# Patient Record
Sex: Male | Born: 1980 | Race: Black or African American | Hispanic: No | Marital: Married | Smoking: Never smoker
Health system: Southern US, Community
[De-identification: ages and names within clinical notes are randomized; demographics above are authoritative.]

## PROBLEM LIST (undated history)

## (undated) DIAGNOSIS — E291 Testicular hypofunction: Secondary | ICD-10-CM

## (undated) HISTORY — DX: Testicular hypofunction: E29.1

---

## 2015-01-21 ENCOUNTER — Ambulatory Visit (INDEPENDENT_AMBULATORY_CARE_PROVIDER_SITE_OTHER): Payer: PRIVATE HEALTH INSURANCE | Admitting: Family Medicine

## 2015-01-21 ENCOUNTER — Encounter: Payer: Self-pay | Admitting: Family Medicine

## 2015-01-21 VITALS — BP 117/74 | HR 61 | Temp 98.1°F | Resp 16 | Ht 67.5 in | Wt 169.0 lb

## 2015-01-21 DIAGNOSIS — T148 Other injury of unspecified body region: Secondary | ICD-10-CM

## 2015-01-21 DIAGNOSIS — L729 Follicular cyst of the skin and subcutaneous tissue, unspecified: Secondary | ICD-10-CM | POA: Diagnosis not present

## 2015-01-21 DIAGNOSIS — W57XXXA Bitten or stung by nonvenomous insect and other nonvenomous arthropods, initial encounter: Secondary | ICD-10-CM | POA: Diagnosis not present

## 2015-01-21 DIAGNOSIS — L639 Alopecia areata, unspecified: Secondary | ICD-10-CM

## 2015-01-21 MED ORDER — FLUTICASONE PROPIONATE 0.05 % EX CREA: TOPICAL_CREAM | CUTANEOUS | Status: DC

## 2015-01-21 NOTE — Progress Notes (Signed)
Office Note 01/21/2015  CC:  Chief Complaint  Patient presents with  . Establish Care  . Tick Removal    a few months, still lump  . Mass    under arm on abd.  . Cyst    on the back of his head  . Urinary Incontinence   HPI:  Paul Mosley is a 34 y.o. Black male who is here to establish care. Patient's most recent primary MD: none Old records were not reviewed prior to or during today's visit.  Has felt a swelling on the scalp for years, not signif enlarging per pt.  No itching or pain.  It started when he was living in [place].  It has never opened up and drained.    Also has tick bite on right upper part of leg, present at least 2 mo and still itches.  It has looked the same the entire time, has not put anything on it.  Wife thought she felt a mass/nodule in right axillary area recently, but now she and pt say they can't find it.  History reviewed. No pertinent past medical history.  History reviewed. No pertinent past surgical history.  Family History  Problem Relation Age of Onset  . Transient ischemic attack Brother 45    History   Social History  . Marital Status: Married    Spouse Name: N/A  . Number of Children: N/A  . Years of Education: N/A   Occupational History  . Not on file.   Social History Main Topics  . Smoking status: Never Smoker   . Smokeless tobacco: Never Used  . Alcohol Use: Yes     Comment: occassionally  . Drug Use: No  . Sexual Activity: Not on file   Other Topics Concern  . Not on file   Social History Narrative   Married, has 2 biologic children, 4 stepchildren.   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   From [place] orig, lived in [place] x 5 yrs prior to coming to [place].   No tob, occ alc, no drugs.    Outpatient Encounter Prescriptions as of 01/21/2015  Medication Sig  . Multiple Vitamin (MULTIVITAMIN) tablet Take 1 tablet by mouth daily.  . fluticasone (CUTIVATE) 0.05 % cream Apply to affected area  bid prn   No facility-administered encounter medications on file as of 01/21/2015.    No Known Allergies  ROS Review of Systems  Constitutional: Negative for fever and fatigue.  HENT: Negative for congestion and sore throat.   Eyes: Negative for visual disturbance.  Respiratory: Negative for cough.   Cardiovascular: Negative for chest pain.  Gastrointestinal: Negative for nausea and abdominal pain.  Genitourinary: Negative for dysuria.       +ED and ? Urinary hesitancy?--not fully discussed today  Musculoskeletal: Negative for back pain and joint swelling.  Skin: Negative for rash.  Neurological: Negative for weakness and headaches.  Hematological: Negative for adenopathy.    PE; Blood pressure 117/74, pulse 61, temperature 98.1 F (36.7 C), temperature source Temporal, resp. rate 16, height 5' 7.5" (1.715 m), weight 169 lb (76.658 kg), SpO2 99 %. Gen: Alert, well appearing.  Patient is oriented to person, place, time, and situation. Head: slight male pattern hair loss, with a bit of patchy alopecia in frontal scalp region as well as right parietal scalp region.  At approximately the vertex of scalp there is a 1-2 cm soft, fluctuant subcutaneous nodule c/w a cyst.  No overlying scalp/skin/hair changes.  No flaking of scalp or rash/erythema of scalp. ENT: Ears: EACs clear, normal epithelium.  TMs with good light reflex and landmarks bilaterally.  Eyes: no injection, icteris, swelling, or exudate.  EOMI, PERRLA. Nose: no drainage or turbinate edema/swelling.  No injection or focal lesion.  Mouth: lips without lesion/swelling.  Oral mucosa pink and moist.  Dentition intact and without obvious caries or gingival swelling.  Oropharynx without erythema, exudate, or swelling.  Neck - No masses or thyromegaly or limitation in range of motion CV: RRR, no m/r/g.  Chest wall, back, and axilla palpated in it's entirety and no nodule/mass is palpable. LUNGS: CTA bilat, nonlabored resps, good  aeration in all lung fields. SKIN: right posterolateral thigh with 2 mm pink papule with the top excoriated.  No erythema or tenderness.  Pertinent labs:  none  ASSESSMENT AND PLAN:   New pt; he'll bring his vaccine records when he comes for next visit.  1) Scalp cyst; reassured, recommended watchful waiting approach.  2) Tick bite, mild inflammitory papule still remaining.  Cutivate 0.05% cream rx'd to apply bid prn.  3) Alopecia areata, scalp.  Reassured.  Pt usually shaves his head, so this is not a cosmetic issue for him.  An After Visit Summary was printed and given to the patient.  Return for return at your convenience to discuss urinary complaint (30 min).

## 2015-01-21 NOTE — Progress Notes (Signed)
Pre visit review using our clinic review tool, if applicable. No additional management support is needed unless otherwise documented below in the visit note. 

## 2015-02-03 ENCOUNTER — Ambulatory Visit (INDEPENDENT_AMBULATORY_CARE_PROVIDER_SITE_OTHER): Payer: PRIVATE HEALTH INSURANCE | Admitting: Family Medicine

## 2015-02-03 ENCOUNTER — Encounter: Payer: Self-pay | Admitting: Family Medicine

## 2015-02-03 VITALS — BP 111/73 | HR 67 | Temp 98.3°F | Resp 16 | Ht 67.5 in | Wt 167.0 lb

## 2015-02-03 DIAGNOSIS — R3916 Straining to void: Secondary | ICD-10-CM

## 2015-02-03 DIAGNOSIS — N411 Chronic prostatitis: Secondary | ICD-10-CM | POA: Diagnosis not present

## 2015-02-03 DIAGNOSIS — R3911 Hesitancy of micturition: Secondary | ICD-10-CM

## 2015-02-03 LAB — POCT URINALYSIS DIPSTICK
Bilirubin, UA: NEGATIVE
Blood, UA: NEGATIVE
Glucose, UA: NEGATIVE
Ketones, UA: NEGATIVE
Leukocytes, UA: NEGATIVE
Nitrite, UA: NEGATIVE
PROTEIN UA: NEGATIVE
Spec Grav, UA: 1.025
Urobilinogen, UA: 0.2
pH, UA: 5.5

## 2015-02-03 MED ORDER — LEVOFLOXACIN 500 MG PO TABS: 500.0000 mg | ORAL_TABLET | Freq: Every day | ORAL | Status: DC

## 2015-02-03 NOTE — Progress Notes (Signed)
OFFICE VISIT  02/03/2015   CC:  Chief Complaint  Patient presents with  . Urinary Retention    x a few years, has to strain   HPI:    Patient is a 34 y.o. African male who presents for at least a few year history of urinary hesitancy and straining to urinate.  Stream is weak and "split in two".  Denies urinary urgency or nocturia.  No dysuria or hematuria.  Denies hx of UTI or STD.  He is married.  He reports no problems with erections or intercourse but does complain of premature ejaculation.  PMH: allergic rhinitis  No past surgical history on file.  Outpatient Prescriptions Prior to Visit  Medication Sig Dispense Refill  . fluticasone (CUTIVATE) 0.05 % cream Apply to affected area bid prn 30 g 0  . Multiple Vitamin (MULTIVITAMIN) tablet Take 1 tablet by mouth daily.     No facility-administered medications prior to visit.    No Known Allergies  ROS As per HPI  PE: Blood pressure 111/73, pulse 67, temperature 98.3 F (36.8 C), temperature source Oral, resp. rate 16, height 5' 7.5" (1.715 m), weight 167 lb (75.751 kg), SpO2 97 %. Gen: Alert, well appearing.  Patient is oriented to person, place, time, and situation. Genitals normal; both testes normal without tenderness, masses, hydroceles, varicoceles, erythema or swelling. Shaft normal, circumcised, meatus normal without discharge. No inguinal hernia noted. No inguinal lymphadenopathy. Rectal exam: negative without mass, lesions or tenderness, PROSTATE EXAM: normal in size, symmetrical, diffuse tenderness noted + need to urinate felt by pt.  No induration or nodularity.  LABS:  CC UA today: normal  IMPRESSION AND PLAN:  1) Lower urinary tract obstructive sx's that suggest urethral stricture. However, with tender prostate today I'll treat empirically for chronic prostatitis with levaquin 500 mg qd x 14d. If improving then will give longer course of abx. If not improved any then will ask urology to see him to assess for  where his level of obstruction may be.  2) Premature ejaculation: discussed some techniques today to help with this.  Spent 30 min with pt today, with >50% of this time spent in counseling and care coordination regarding the above problems.  An After Visit Summary was printed and given to the patient.  FOLLOW UP:  He is to call in 2 wks with report of how he has responded to the antibiotic

## 2015-02-03 NOTE — Progress Notes (Signed)
Pre visit review using our clinic review tool, if applicable. No additional management support is needed unless otherwise documented below in the visit note. 

## 2015-11-08 DIAGNOSIS — E291 Testicular hypofunction: Secondary | ICD-10-CM

## 2015-11-08 HISTORY — DX: Testicular hypofunction: E29.1

## 2015-11-29 ENCOUNTER — Ambulatory Visit (INDEPENDENT_AMBULATORY_CARE_PROVIDER_SITE_OTHER): Payer: BLUE CROSS/BLUE SHIELD | Admitting: Family Medicine

## 2015-11-29 ENCOUNTER — Encounter: Payer: Self-pay | Admitting: Family Medicine

## 2015-11-29 VITALS — BP 135/92 | HR 64 | Temp 98.3°F | Resp 16 | Ht 67.5 in | Wt 166.8 lb

## 2015-11-29 DIAGNOSIS — R6882 Decreased libido: Secondary | ICD-10-CM | POA: Diagnosis not present

## 2015-11-29 DIAGNOSIS — R5383 Other fatigue: Secondary | ICD-10-CM | POA: Diagnosis not present

## 2015-11-29 NOTE — Progress Notes (Signed)
OFFICE VISIT  11/29/2015   CC:  Chief Complaint  Patient presents with  . Fatigue  . Insomnia   HPI:    Patient is a 35 y.o.  male who presents for fatigue. States he feels very tired upon awakening and can't get himself to feel awake for a while during the day most days. Onset about a month ago, admits some days are worse than others.  No excessive daytime sleepiness.  Gets about 7-8 hours of sleep per night. Says his body feels tired.  His work is a lot of physical labor.  Some occasional exercise but nothing regular.  Some issues with lack of sexual drive the last 1 mo, occ also erectile dysfunction.  Has had snoring but no witnessed apneic events. +allergic rhinitis sx's more lately.  Sounds like he has a stressful home life: has current wife here, has former wife with his kids in [place].  They all fight a lot some days.    No past medical history on file. No signif past medical problems.  No past surgical history on file. No past surgeries.  Social History   Social History Narrative   Married, has 2 biologic children, 4 stepchildren.   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   From [place], lived in [place] x 5 yrs [place] to coming to [place].   No tob, occ alc, no drugs.    MEDS: MVI qd, otc eye drop and oral allergy med on/off lately  No Known Allergies  ROS Review of Systems  Constitutional: Negative for fever, chills, appetite change and fatigue.  HENT: Positive for congestion, postnasal drip, rhinorrhea and sneezing. Negative for dental problem, ear pain and sore throat.   Eyes: Negative for discharge, redness and visual disturbance.  Respiratory: Negative for cough, chest tightness, shortness of breath and wheezing.   Cardiovascular: Negative for chest pain, palpitations and leg swelling.  Gastrointestinal: Negative for nausea, vomiting, abdominal pain, diarrhea and blood in stool.  Genitourinary: Negative for dysuria, urgency,  frequency, hematuria, flank pain and difficulty urinating.  Musculoskeletal: Negative for myalgias, back pain, joint swelling, arthralgias and neck stiffness.  Skin: Negative for pallor and rash.  Neurological: Negative for dizziness, speech difficulty, weakness and headaches.  Hematological: Negative for adenopathy. Does not bruise/bleed easily.  Psychiatric/Behavioral: Negative for confusion and sleep disturbance. The patient is not nervous/anxious.    PE: Blood pressure 135/92, pulse 64, temperature 98.3 F (36.8 C), temperature source Oral, resp. rate 16, height 5' 7.5" (1.715 m), weight 166 lb 12 oz (75.637 kg), SpO2 98 %.  Gen: Alert, well appearing.  Patient is oriented to person, place, time, and situation. HQI:ONGEENT:Eyes: no injection, icteris, swelling, or exudate.  EOMI, PERRLA. Mouth: lips without lesion/swelling.  Oral mucosa pink and moist. Oropharynx without erythema, exudate, or swelling.  Neck - No masses or thyromegaly or limitation in range of motion CV: RRR, no m/r/g.   LUNGS: CTA bilat, nonlabored resps, good aeration in all lung fields. ABD: soft, NT, ND, BS normal.  No hepatospenomegaly or mass.  No bruits. EXT: no clubbing, cyanosis, or edema.   LABS:  None today Quality of sleep questionairre score= 5 (low risk of OSA)  IMPRESSION AND PLAN:  Fatigue x 1 mo or so. His stressful home life probably is playing a role (poor sleep, lots of arguments). His job is pretty physical as well. His VS and exam are reassuring here today. Will check CBC, CMET, TSH, and testosterone labs. If all  normal then will reassure pt and take watchful waiting approach.  An After Visit Summary was printed and given to the patient.  FOLLOW UP: Return if symptoms worsen or fail to improve.  Signed:  Santiago Bumpers, MD           11/29/2015

## 2015-11-29 NOTE — Progress Notes (Signed)
Pre visit review using our clinic review tool, if applicable. No additional management support is needed unless otherwise documented below in the visit note. 

## 2015-11-30 LAB — CBC WITH DIFFERENTIAL/PLATELET
Basophils Absolute: 0 10*3/uL (ref 0.0–0.1)
Basophils Relative: 0.3 % (ref 0.0–3.0)
Eosinophils Absolute: 0.1 10*3/uL (ref 0.0–0.7)
Eosinophils Relative: 2.8 % (ref 0.0–5.0)
HEMATOCRIT: 39.6 % (ref 39.0–52.0)
Hemoglobin: 13.4 g/dL (ref 13.0–17.0)
Lymphocytes Relative: 39.4 % (ref 12.0–46.0)
Lymphs Abs: 1.8 10*3/uL (ref 0.7–4.0)
MCHC: 33.8 g/dL (ref 30.0–36.0)
MCV: 85.6 fl (ref 78.0–100.0)
MONOS PCT: 7.2 % (ref 3.0–12.0)
Monocytes Absolute: 0.3 10*3/uL (ref 0.1–1.0)
NEUTROS ABS: 2.3 10*3/uL (ref 1.4–7.7)
NEUTROS PCT: 50.3 % (ref 43.0–77.0)
Platelets: 194 10*3/uL (ref 150.0–400.0)
RBC: 4.63 Mil/uL (ref 4.22–5.81)
RDW: 13.6 % (ref 11.5–15.5)
WBC: 4.5 10*3/uL (ref 4.0–10.5)

## 2015-11-30 LAB — COMPREHENSIVE METABOLIC PANEL
ALT: 28 U/L (ref 0–53)
AST: 34 U/L (ref 0–37)
Albumin: 4.7 g/dL (ref 3.5–5.2)
Alkaline Phosphatase: 48 U/L (ref 39–117)
BILIRUBIN TOTAL: 0.6 mg/dL (ref 0.2–1.2)
BUN: 24 mg/dL — ABNORMAL HIGH (ref 6–23)
CO2: 28 meq/L (ref 19–32)
CREATININE: 1.13 mg/dL (ref 0.40–1.50)
Calcium: 9.9 mg/dL (ref 8.4–10.5)
Chloride: 105 mEq/L (ref 96–112)
GFR: 94.04 mL/min (ref >60.00)
GLUCOSE: 89 mg/dL (ref 70–99)
Potassium: 4.2 mEq/L (ref 3.5–5.1)
Sodium: 139 mEq/L (ref 135–145)
TOTAL PROTEIN: 7 g/dL (ref 6.0–8.3)

## 2015-11-30 LAB — TESTOSTERONE TOTAL,FREE,BIO, MALES
Albumin: 4.7 g/dL (ref 3.6–5.1)
SEX HORMONE BINDING: 34 nmol/L (ref 10–50)
Testosterone, Bioavailable: 53.3 ng/dL — ABNORMAL LOW (ref 130.5–681.7)
Testosterone, Free: 24.9 pg/mL — ABNORMAL LOW (ref 47.0–244.0)
Testosterone: 209 ng/dL — ABNORMAL LOW (ref 250–827)

## 2015-11-30 LAB — TSH: TSH: 2.35 u[IU]/mL (ref 0.35–4.50)

## 2015-12-01 ENCOUNTER — Other Ambulatory Visit: Payer: Self-pay | Admitting: Family Medicine

## 2015-12-01 DIAGNOSIS — R7989 Other specified abnormal findings of blood chemistry: Secondary | ICD-10-CM

## 2015-12-03 ENCOUNTER — Other Ambulatory Visit (INDEPENDENT_AMBULATORY_CARE_PROVIDER_SITE_OTHER): Payer: BLUE CROSS/BLUE SHIELD

## 2015-12-03 DIAGNOSIS — E291 Testicular hypofunction: Secondary | ICD-10-CM | POA: Diagnosis not present

## 2015-12-03 DIAGNOSIS — R7989 Other specified abnormal findings of blood chemistry: Secondary | ICD-10-CM

## 2015-12-03 LAB — LUTEINIZING HORMONE: LH: 6.2 m[IU]/mL (ref 1.5–9.3)

## 2015-12-03 LAB — PROLACTIN: Prolactin: 6.3 ng/mL (ref 2.0–18.0)

## 2015-12-04 LAB — PSA: PSA: 0.22 ng/mL (ref <=4.00)

## 2015-12-07 LAB — TESTOSTERONE TOTAL,FREE,BIO, MALES
ALBUMIN: 4.7 g/dL (ref 3.6–5.1)
Sex Hormone Binding: 28 nmol/L (ref 10–50)
TESTOSTERONE: 208 ng/dL — AB (ref 250–827)
Testosterone, Bioavailable: 61.6 ng/dL — ABNORMAL LOW (ref 130.5–681.7)
Testosterone, Free: 28.7 pg/mL — ABNORMAL LOW (ref 47.0–244.0)

## 2015-12-08 ENCOUNTER — Other Ambulatory Visit: Payer: Self-pay | Admitting: Family Medicine

## 2015-12-08 ENCOUNTER — Encounter: Payer: Self-pay | Admitting: Internal Medicine

## 2015-12-08 ENCOUNTER — Encounter: Payer: Self-pay | Admitting: Family Medicine

## 2015-12-08 DIAGNOSIS — E291 Testicular hypofunction: Secondary | ICD-10-CM

## 2016-01-27 ENCOUNTER — Encounter: Payer: Self-pay | Admitting: Internal Medicine

## 2016-01-27 ENCOUNTER — Ambulatory Visit (INDEPENDENT_AMBULATORY_CARE_PROVIDER_SITE_OTHER): Payer: BLUE CROSS/BLUE SHIELD | Admitting: Internal Medicine

## 2016-01-27 VITALS — BP 114/80 | HR 69 | Ht 67.0 in | Wt 162.0 lb

## 2016-01-27 DIAGNOSIS — E291 Testicular hypofunction: Secondary | ICD-10-CM | POA: Diagnosis not present

## 2016-01-27 DIAGNOSIS — E230 Hypopituitarism: Secondary | ICD-10-CM | POA: Insufficient documentation

## 2016-01-27 NOTE — Progress Notes (Signed)
Patient ID: Paul Mosley, male   DOB: Aug 11, 1980, 35 y.o.   MRN: [id number]  HPI: [name] is a 35 y.o.-year-old man, referred by his PCP, Dr.McGowan, for evaluation and management of low testosterone. He moved here from [place] in 05/2014.  Pt describes - "for years" he had: - fatigue - loss of libido - difficulty maintaining an erection - hair loss - no gynecomastia  Reviewed labs: Component     Latest Ref Rng 11/29/2015 (4:20 pm) 12/03/2015 (8:20 am)  Testosterone     250 - 827 ng/dL 782 (L) 956 (L)  Albumin     3.6 - 5.1 g/dL 4.7 4.7  Sex Horm Binding Glob, Serum     10 - 50 nmol/L 34 28  Testosterone Free     47.0 - 244.0 pg/mL 24.9 (L) 28.7 (L)  Testosterone, Bioavailable     130.5 - 681.7 ng/dL 21.3 (L) 08.6 (L)  TSH     0.35 - 4.50 uIU/mL 2.35   PSA     <=4.00 ng/mL  0.22  Prolactin     2.0 - 18.0 ng/mL  6.3  LH     1.5 - 9.3 mIU/mL  6.2   He admits for decreased libido Has difficulty maintaining an erection No trauma to testes, testicular irradiation or surgery. He has occasional pain in R testicle - not recently. No h/o of mumps orchitis/h/o autoimmune ds. No h/o cryptorchidism He grew and went through puberty like his peers No shrinking of testes. No very small testes (<5 ml) No incomplete/delayed sexual development     No breast discomfort/gynecomastia    No loss of body hair (axillary/pubic)/decreased need for shaving No height loss No abnormal sense of smell + hot flushes No vision problems other than dry eyes from allergies No worst HA of his life No FH of hypogonadism/infertility  No personal h/o infertility - has 2 children (7 and 37 y/o) No FH of hemochromatosis or pituitary tumors No excessive weight gain or loss.  No chronic diseases. No chronic pain. Not on opiates, does not take steroids.  No more than 2 drinks a day of alcohol at a time, and this is rarely No anabolic steroids use Not on antidepressants  No AI ds in his family. He does  not have family history of early cardiac disease.  He started to w/u at the gym 3 weeks ago.  Less sleep - has an 11 mo old in the house.   He started herbal meds 3 mo ago ([employer]): - MVI - Male-Factor 1000 - Sea Buckthorn, Nettle leaf extract, Green Oat extract, Asian Ginseng, Eleuthero extract - RoseGuard - Astragalus Root Extract, Rosemary, turmeric - Garden 7 - Garlic, Carrot, Broccoli, Hesperidin, Quercetin - Aminogen - Protease - Ultimate Prostate formula - Saw palmetto, lycopene, pumpkin seed extract - Cell activator - ALA, pine bark extract, resveratrol, rhodiola root extract - Mega Garlic - garlic powder - Ocular Defense formula - Lutein  ROS:  Constitutional: + see HPI  Eyes: no blurry vision, no xerophthalmia ENT: no sore throat, no nodules palpated in throat, no dysphagia/odynophagia, no hoarseness, + tinnitus Cardiovascular: no CP/SOB/palpitations/leg swelling Respiratory: no cough/SOB Gastrointestinal: no N/V/D/+ C Musculoskeletal: no muscle/+ joint aches Skin: no rashes, + Hair loss Neurological: no tremors/numbness/tingling/dizziness Psychiatric: no depression/anxiety  Past Medical History  Diagnosis Date  . Hypogonadism, male 11/2015   No past surgical history on file. Social History   Social History Main Topics  . Smoking status: Never Smoker   . Smokeless tobacco:  Never Used  . Alcohol Use: Yes     Comment: occassionally  . Drug Use: No   Social History Narrative   Married, has 2 biologic children, 4 stepchildren.   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   From [place], lived in [place] x 5 [place]rs [place] to [place].   No tob, occ alc, no drugs.   Current Outpatient Prescriptions on File Prior to Visit  Medication Sig Dispense Refill  . Multiple Vitamin (MULTIVITAMIN) tablet Take 1 tablet by mouth daily.     No current facility-administered medications on file prior to visit.   No Known Allergies Family History   Problem Relation Age of Onset  . Transient ischemic attack Brother 45   PE: BP 114/80 mmHg  Pulse 69  Ht 5\' 7"  (1.702 m)  Wt 162 lb (73.483 kg)  BMI 25.37 kg/m2  SpO2 93% Wt Readings from Last 3 Encounters:  01/27/16 162 lb (73.483 kg)  11/29/15 166 lb 12 oz (75.637 kg)  02/03/15 167 lb (75.751 kg)   Constitutional: normal weight, in NAD Eyes: PERRLA, EOMI, no exophthalmos ENT: moist mucous membranes, no thyromegaly, no cervical lymphadenopathy Cardiovascular: RRR, No MRG Respiratory: CTA B Gastrointestinal: abdomen soft, NT, ND, BS+ Musculoskeletal: no deformities, strength intact in all 4 Skin: moist, warm, no rashes Neurological: no tremor with outstretched hands, DTR normal in all 4 Genital exam: normal male escutcheon, no inguinal LAD, normal phallus, testes ~20 mL, no testicular masses, no penile discharge.  No gynecomastia.  ASSESSMENT: 1. Hypogonadotropic hypogonadism  PLAN:  1.  I had a  discussion with the pt regarding his testosterone results, which we reviewed together. The total and free testosterone were both low, the first level being drawn in p.m., and the second in a.m. He also had a normal prolactin and a normal TSH level, and an inappropriately normal LH level at the time of the second draw. This would qualify him for hypogonadotropic hypogonadism. - We discussed about possible causes for secondary hypogonadism:  Increased stress (he has a toddler at home, but otherwise no increased stress at work)  Lack of sleep (he complains about this)  Poor diet (denies)  Chronic conditions including pain (denies)  Alcohol and other drugs (denies)  Prescription and nonprescription medications (he is on quite a few supplements, which reviewed together at the time of the visit); he started these 3 months ago, while his testosterone was checked and found low 2 months ago. Reviewing the constituents, most of them are designed to improve the testosterone level,  however, I am not sure and how all would interact with each other. To obtain a better picture, I advised him to stop the herbal supplements for the next 1.5-2 months, after which to recheck his testosterone level, at 8 AM, fasting.  Pituitary tumors (if the testosterone returns low again after stopping the supplements, he will need full pituitary investigation, possibly also with an MRI). Of note, prolactin level was normal.  Genetic syndromes (I doubt this since previous good fertility and no other suggestive signs)  Etc. - I think his lack of sleep also contribute to his low testosterone, we also discussed about improving his diet. He started to exercise at the gym 3 weeks ago and he is feeling better. Advised him to continue this.  - I will see the patient back in 4 months.  Carlus Pavlovristina Myangel Summons, MD PhD [facility]

## 2016-01-27 NOTE — Patient Instructions (Signed)
Please stop all the herbal supplements for 1.5-2 months, then come for a repeat testosterone level at 8 am, fasting.  Try to get more sleep!  Please come back for a follow-up appointment in 4 months.

## 2016-01-28 ENCOUNTER — Other Ambulatory Visit: Payer: BLUE CROSS/BLUE SHIELD

## 2016-03-23 ENCOUNTER — Other Ambulatory Visit: Payer: Self-pay | Admitting: Internal Medicine

## 2016-03-23 DIAGNOSIS — E230 Hypopituitarism: Secondary | ICD-10-CM

## 2016-03-30 ENCOUNTER — Other Ambulatory Visit: Payer: BLUE CROSS/BLUE SHIELD

## 2016-03-30 DIAGNOSIS — E230 Hypopituitarism: Secondary | ICD-10-CM

## 2016-03-31 LAB — TESTOSTERONE TOTAL,FREE,BIO, MALES
Albumin: 4.5 g/dL (ref 3.6–5.1)
SEX HORMONE BINDING: 38 nmol/L (ref 10–50)
TESTOSTERONE FREE: 40.5 pg/mL — AB (ref 46.0–224.0)
Testosterone, Bioavailable: 83.2 ng/dL — ABNORMAL LOW (ref 110.0–575.0)
Testosterone: 353 ng/dL (ref 250–827)

## 2016-05-25 ENCOUNTER — Ambulatory Visit (INDEPENDENT_AMBULATORY_CARE_PROVIDER_SITE_OTHER): Payer: BLUE CROSS/BLUE SHIELD | Admitting: Medical

## 2016-05-25 ENCOUNTER — Encounter: Payer: Self-pay | Admitting: Medical

## 2016-05-25 VITALS — BP 112/74 | HR 54 | Temp 98.4°F | Ht 67.0 in | Wt 157.4 lb

## 2016-05-25 DIAGNOSIS — J069 Acute upper respiratory infection, unspecified: Secondary | ICD-10-CM | POA: Diagnosis not present

## 2016-05-25 MED ORDER — BENZONATATE 100 MG PO CAPS: 100.0000 mg | ORAL_CAPSULE | Freq: Three times a day (TID) | ORAL | 0 refills | Status: DC | PRN

## 2016-05-25 MED ORDER — FLUTICASONE PROPIONATE 50 MCG/ACT NA SUSP: 2.0000 | Freq: Every day | NASAL | 1 refills | Status: DC

## 2016-05-25 MED ORDER — AMOXICILLIN-POT CLAVULANATE 875-125 MG PO TABS: 1.0000 | ORAL_TABLET | Freq: Two times a day (BID) | ORAL | 0 refills | Status: DC

## 2016-05-25 NOTE — Patient Instructions (Addendum)
You appear to have upper respiratory infection. I will rx flonase for congestion and benzonate for your cough. Rest and hydrate as well.  Your left ear tympanic membrane has faint mild red appearance. If you have any ear pain over next week, bronchitis symptoms or sinus pressure then start augmentin.(making available if you were to worsen).  Follow up in 7 days or as needed

## 2016-05-25 NOTE — Progress Notes (Signed)
Pre visit review using our clinic review tool, if applicable. No additional management support is needed unless otherwise documented below in the visit note./[facility]  

## 2016-05-25 NOTE — Progress Notes (Signed)
Subjective:    Patient ID: Paul Mosley, male    DOB: Aug 10, 1980, 35 y.o.   MRN: [id number]  HPI  Pt in for 1st time. Pt of Dr. Milinda CaveMcGowen.  Pt states sick since last weekend. He states faint st and hoarse voice. St feels better now. But he states over week getting nasal congestion and cough. No sinus pain. He has dry cough. Pt has no wheezing. Some sneezing. Pt states 2 days ago blowing nose got yellow mucous.   No fever, no chills, no sweats or body aches.   Review of Systems  Constitutional: Negative for chills and fatigue.  HENT: Positive for congestion, rhinorrhea and sneezing. Negative for ear pain, postnasal drip, sinus pain and sinus pressure.   Respiratory: Positive for cough. Negative for choking, chest tightness, shortness of breath and wheezing.   Cardiovascular: Negative for chest pain and palpitations.  Gastrointestinal: Negative for abdominal pain.  Musculoskeletal: Negative for back pain.  Neurological: Negative for dizziness and headaches.  Hematological: Negative for adenopathy. Does not bruise/bleed easily.  Psychiatric/Behavioral: Negative for behavioral problems and confusion.    Past Medical History:  Diagnosis Date  . Hypogonadism, male 11/2015   hypogonadotropic hypogonadism     Social History   Social History  . Marital status: Married    Spouse name: N/A  . Number of children: N/A  . Years of education: N/A   Occupational History  . Not on file.   Social History Main Topics  . Smoking status: Never Smoker  . Smokeless tobacco: Never Used  . Alcohol use Yes     Comment: occassionally  . Drug use: No  . Sexual activity: Not on file   Other Topics Concern  . Not on file   Social History Narrative   Married, has 2 biologic children, 4 stepchildren.   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   From [place], lived in [place] x 5 [place]rs [place] to coming to [place].   No tob, occ alc, no drugs.    No past surgical history on  file.  Family History  Problem Relation Age of Onset  . Transient ischemic attack Brother 45    No Known Allergies  Current Outpatient Prescriptions on File Prior to Visit  Medication Sig Dispense Refill  . Multiple Vitamin (MULTIVITAMIN) tablet Take 1 tablet by mouth daily.     No current facility-administered medications on file prior to visit.     BP 112/74 (BP Location: Left Arm, Patient Position: Sitting, Cuff Size: Large)   Pulse (!) 54   Temp 98.4 F (36.9 C) (Oral)   Ht 5\' 7"  (1.702 m)   Wt 157 lb 6.4 oz (71.4 kg)   SpO2 97%   BMI 24.65 kg/m       Objective:   Physical Exam  General  Mental Status - Alert. General Appearance - Well groomed. Not in acute distress. Voice does not sound hoarse to me.  Skin Rashes- No Rashes.  HEENT Head- Normal. Ear Auditory Canal - Left- Normal. Right - Normal.Tympanic Membrane- Left- mild central redness Right- Normal. Eye Sclera/Conjunctiva- Left- Normal. Right- Normal. Nose & Sinuses Nasal Mucosa- Left-  Boggy and Congested. Right-  Boggy and  Congested.Bilateral no  maxillary and  No frontal sinus pressure. Mouth & Throat Lips: Upper Lip- Normal: no dryness, cracking, pallor, cyanosis, or vesicular eruption. Lower Lip-Normal: no dryness, cracking, pallor, cyanosis or vesicular eruption. Buccal Mucosa- Bilateral- No Aphthous ulcers. Oropharynx- No Discharge or Erythema.  Tonsils: Characteristics- Bilateral- No Erythema or Congestion. Size/Enlargement- Bilateral- No enlargement. Discharge- bilateral-None.  Neck Neck- Supple. No Masses.   Chest and Lung Exam Auscultation: Breath Sounds:-Clear even and unlabored.  Cardiovascular Auscultation:Rythm- Regular, rate and rhythm. Murmurs & Other Heart Sounds:Ausculatation of the heart reveal- No Murmurs.  Lymphatic Head & Neck General Head & Neck Lymphatics: Bilateral: Description- No Localized lymphadenopathy.      Assessment & Plan:  You appear to have upper  respiratory infection. I will rx flonase for congestion and benzonate for your cough. Rest and hydrate as well.  Your left ear tympanic membrane has faint mild red appearance. If you have any ear pain over next week, bronchitis symptoms or sinus pressure then start augmentin.(making available if you were to worsen).  Did explain regarding hoarse voice. That if this persists past 2 wks then would refer to ENT(but not to me he does not sound hoarse. But my first time seeing pt)  Follow up in 7 days or as needed

## 2016-05-29 ENCOUNTER — Encounter: Payer: Self-pay | Admitting: Internal Medicine

## 2016-05-29 ENCOUNTER — Ambulatory Visit (INDEPENDENT_AMBULATORY_CARE_PROVIDER_SITE_OTHER): Payer: BLUE CROSS/BLUE SHIELD | Admitting: Internal Medicine

## 2016-05-29 VITALS — BP 126/84 | HR 67 | Ht 67.0 in | Wt 158.0 lb

## 2016-05-29 DIAGNOSIS — E230 Hypopituitarism: Secondary | ICD-10-CM

## 2016-05-29 DIAGNOSIS — E291 Testicular hypofunction: Secondary | ICD-10-CM

## 2016-05-29 NOTE — Progress Notes (Signed)
Patient ID: Paul Mosley, male   DOB: September 03, 1980, 35 y.o.   MRN: [id number]  HPI: Paul Mosley is a 35 y.o.-year-old man, returning for follow-up for low testosterone. Last visit 4 months ago.  He has a URI today. He is on Flonase, ABx.  He was initially referred to endocrinology because of low testosterone levels in the context of fatigue, decreased libido, difficulty maintaining an erection. At last visit, reviewing his medication regimen, he was on several supplements study 3 months prior to the visit ([employer]): - MVI - Male-Factor 1000 - Sea Buckthorn, Nettle leaf extract, Green Oat extract, Asian Ginseng, Eleuthero extract - RoseGuard - Astragalus Root Extract, Rosemary, turmeric - Garden 7 - Garlic, Carrot, Broccoli, Hesperidin, Quercetin - Aminogen - Protease - Ultimate Prostate formula - Saw palmetto, lycopene, pumpkin seed extract - Cell activator - ALA, pine bark extract, resveratrol, rhodiola root extract - Mega Garlic - garlic powder - Ocular Defense formula - Lutein  After stopping the supplements >> he feels much better, less tired.  He also changed his diet after our last visit's discussion >> more veggies. He is now going to the gym 5/7 days (4:30 am, for 1h) - not in last week 2/2 URI.  He still has lower libido, but no erection pbs, no more hot flushes.  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 A subsequent testosterone level was still abnormal, but much improved after only 2 months off the supplements: Component     Latest Ref Rng & Units 03/30/2016  Testosterone     250 - 827 ng/dL [id number]  Albumin     3.6 - 5.1 g/dL 4.5  Sex Horm Binding Glob, Serum     10 - 50 nmol/L 38  Testosterone Free     46.0 - 224.0 pg/mL 40.5 (L)  Testosterone, Bioavailable     110.0 - 575.0 ng/dL 21.383.2 (L)   Reviewed pertinent history He admits for decreased libido Has difficulty maintaining an erection No trauma to testes, testicular irradiation or surgery. He has occasional pain in R testicle - not recently. No h/o of mumps orchitis/h/o autoimmune ds. No h/o cryptorchidism He grew and went through puberty like his peers No shrinking of testes. No very small testes (<5 ml) No incomplete/delayed sexual development     No breast discomfort/gynecomastia    No loss of body hair (axillary/pubic)/decreased need for shaving No height loss No abnormal sense of smell Had hot flushes >> now resolved No vision problems other than dry eyes from allergies No worst HA of his life No FH of hypogonadism/infertility  No personal h/o infertility - has 2 children No  FH of hemochromatosis or pituitary tumors No excessive weight gain or loss.  No chronic diseases. No chronic pain. Not on opiates, does not take steroids.  No more than 2 drinks a day of alcohol at a time, and this is rarely No anabolic steroids use Not on antidepressants  No AI ds in his family. He does not have family history of early cardiac disease.  ROS:  Constitutional: + see HPI  Eyes: no blurry vision, no xerophthalmia ENT: + sore throat, no nodules palpated in throat, no dysphagia/odynophagia, + hoarseness Cardiovascular: no CP/SOB/palpitations/leg swelling Respiratory: + cough/no SOB Gastrointestinal: no N/V/+ D/+ C Musculoskeletal: no muscle/joint aches Skin: no rashes, + Hair loss, +  itching Neurological: no tremors/numbness/tingling/dizziness  I reviewed pt's medications, allergies, PMH, social hx, family hx, and changes were documented in the history of present illness. Otherwise, unchanged from my initial visit note:  Past Medical History:  Diagnosis Date  . Hypogonadism, male 11/2015   hypogonadotropic hypogonadism   No past surgical history on file. Social History   Social History Main Topics  . Smoking status: Never Smoker   . Smokeless tobacco: Never Used  . Alcohol Use: Yes     Comment: occassionally  . Drug Use: No   Social History Narrative   Married, has 2 biologic children, 4 stepchildren.   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   From [place], lived in [place] x 5 [place]rs prior to coming to [place].   No tob, occ alc, no drugs.   Current Outpatient Prescriptions on File Prior to Visit  Medication Sig Dispense Refill  . amoxicillin-clavulanate (AUGMENTIN) 875-125 MG tablet Take 1 tablet by mouth 2 (two) times daily. 20 tablet 0  . benzonatate (TESSALON) 100 MG capsule Take 1 capsule (100 mg total) by mouth 3 (three) times daily as needed for cough. 21 capsule 0  . fluticasone (FLONASE) 50 MCG/ACT nasal spray Place 2 sprays into both nostrils daily. 16 g 1  . Multiple Vitamin (MULTIVITAMIN) tablet Take 1 tablet by mouth daily.     No current facility-administered medications on file prior to visit.    No Known Allergies Family History  Problem Relation Age of Onset  . Transient ischemic attack Brother 45   PE: BP 126/84   Pulse 67   Ht 5\' 7"  (1.702 m)   Wt 158 lb (71.7 kg)   SpO2 95%   BMI 24.75 kg/m  Wt Readings from Last 3 Encounters:  05/29/16 158 lb (71.7 kg)  05/25/16 157 lb 6.4 oz (71.4 kg)  01/27/16 162 lb (73.5 kg)   Constitutional: normal weight, in NAD Eyes: PERRLA, EOMI, no exophthalmos ENT: moist mucous membranes, no thyromegaly, no cervical lymphadenopathy Cardiovascular: RRR, No MRG Respiratory: CTA  B Gastrointestinal: abdomen soft, NT, ND, BS+ Musculoskeletal: no deformities, strength intact in all 4 Skin: moist, warm, no rashes Neurological: + mild tremor with outstretched hands (did not eat today yet), DTR normal in all 4  ASSESSMENT: 1. Hypogonadotropic hypogonadism  PLAN:  1.  I reviewed previous testosterone results along with the patient. In 11/2015: The total and free testosterone were both low, the first level being drawn in p.m., and the second in a.m. his LH was inappropriately normal, performing hypogonadotropic hypogonadism. The rest of the pituitary hormones were normal. - At that time and again today, we discussed about possible causes for secondary hypogonadism:  Increased stress (he has a toddler at home, but otherwise no increased stress at work)  Lack of sleep (he complains about this)  Poor diet (denies)  Chronic conditions including pain (denies)  Alcohol and other drugs (denies)  Pituitary tumors (if the testosterone returns low again after stopping the supplements, he will need full pituitary investigation, possibly also with an MRI). Of note, prolactin level was normal.  Genetic syndromes (I doubt this since previous good fertility and no other suggestive signs)  Etc.  Prescription and nonprescription medications - at last visit, he was taking a significant amount of supplements started 3 months prior to the visit >>  I advised him to stop the herbal supplements for the next 1.5-2 months, after which to recheck his testosterone level, at 8 AM, fasting >> a subsequent testosterone level returned still low, but much improved compared to before - At this visit, I suggested to recheck his testosterone level now, 4 months after stopping the supplements, fasting, at 8 AM. If this level is still low, he may need to start testosterone replacement versus clomiphene, but I exlpained that since the testosterone level is now only mildly low, he does not necessarily  need supplementation and we can continue to follow the levels to see if they improve further. He agrees. - I will see the patient back in 6 months.  Orders Placed This Encounter  Procedures  . Testosterone, F Eqlib+T LC/MS   Component     Latest Ref Rng & Units 05/30/2016 (8:21 am)  Testosterone, total     264.0 - 916.0 ng/dL 161.0  Testosterone,Free     5.00 - 21.00 ng/dL 9.60  % Free Testosterone     1.50 - 4.20 % 2.14   Total and free testosterone levels normal!  No further intervention needed. Continue with lifestyle changes that he already initiated. I will see the patient on an as needed basis.  Carlus Pavlov, MD PhD [facility]

## 2016-05-29 NOTE — Patient Instructions (Signed)
Please come back for labs fasting, in am (8 am).  Please come back for a follow-up appointment in 6 months.

## 2016-05-30 ENCOUNTER — Other Ambulatory Visit: Payer: BLUE CROSS/BLUE SHIELD

## 2016-05-30 DIAGNOSIS — E230 Hypopituitarism: Secondary | ICD-10-CM

## 2016-06-03 LAB — TESTOSTERONE, F EQLIB+T LC/MS
TESTOSTERONE FREE PCT: 2.14 % (ref 1.50–4.20)
Testosterone, Free: 9.66 ng/dL (ref 5.00–21.00)
Testosterone, total: 451.3 ng/dL (ref 264.0–916.0)

## 2016-06-05 ENCOUNTER — Encounter: Payer: Self-pay | Admitting: Family Medicine

## 2016-06-05 ENCOUNTER — Encounter: Payer: Self-pay | Admitting: Internal Medicine

## 2016-07-26 ENCOUNTER — Ambulatory Visit: Payer: BLUE CROSS/BLUE SHIELD | Admitting: Family Medicine

## 2016-07-27 ENCOUNTER — Encounter: Payer: Self-pay | Admitting: Family Medicine

## 2018-05-23 ENCOUNTER — Emergency Department (HOSPITAL_COMMUNITY): Payer: Self-pay

## 2018-05-23 ENCOUNTER — Encounter (HOSPITAL_COMMUNITY): Payer: Self-pay | Admitting: Emergency Medicine

## 2018-05-23 ENCOUNTER — Emergency Department (HOSPITAL_COMMUNITY)
Admission: EM | Admit: 2018-05-23 | Discharge: 2018-05-23 | Disposition: A | Discharge status: Home / Self Care | Payer: Self-pay | Attending: Emergency Medicine | Admitting: Emergency Medicine

## 2018-05-23 ENCOUNTER — Other Ambulatory Visit: Payer: Self-pay

## 2018-05-23 DIAGNOSIS — Z7982 Long term (current) use of aspirin: Secondary | ICD-10-CM | POA: Insufficient documentation

## 2018-05-23 DIAGNOSIS — R1031 Right lower quadrant pain: Secondary | ICD-10-CM | POA: Insufficient documentation

## 2018-05-23 DIAGNOSIS — Z79899 Other long term (current) drug therapy: Secondary | ICD-10-CM | POA: Insufficient documentation

## 2018-05-23 LAB — URINALYSIS, ROUTINE W REFLEX MICROSCOPIC
Bilirubin Urine: NEGATIVE
GLUCOSE, UA: NEGATIVE mg/dL
Hgb urine dipstick: NEGATIVE
Ketones, ur: NEGATIVE mg/dL
Leukocytes, UA: NEGATIVE
Nitrite: NEGATIVE
PH: 7 (ref 5.0–8.0)
PROTEIN: NEGATIVE mg/dL
Specific Gravity, Urine: 1.015 (ref 1.005–1.030)

## 2018-05-23 LAB — CBC
HCT: 44.3 % (ref 39.0–52.0)
Hemoglobin: 14.8 g/dL (ref 13.0–17.0)
MCH: 28.3 pg (ref 26.0–34.0)
MCHC: 33.4 g/dL (ref 30.0–36.0)
MCV: 84.7 fL (ref 80.0–100.0)
NRBC: 0 % (ref 0.0–0.2)
PLATELETS: 187 10*3/uL (ref 150–400)
RBC: 5.23 MIL/uL (ref 4.22–5.81)
RDW: 13.3 % (ref 11.5–15.5)
WBC: 3.5 10*3/uL — AB (ref 4.0–10.5)

## 2018-05-23 LAB — COMPREHENSIVE METABOLIC PANEL
ALK PHOS: 46 U/L (ref 38–126)
ALT: 24 U/L (ref 0–44)
AST: 31 U/L (ref 15–41)
Albumin: 3.9 g/dL (ref 3.5–5.0)
Anion gap: 7 (ref 5–15)
BILIRUBIN TOTAL: 1.1 mg/dL (ref 0.3–1.2)
BUN: 11 mg/dL (ref 6–20)
CALCIUM: 9.3 mg/dL (ref 8.9–10.3)
CO2: 25 mmol/L (ref 22–32)
CREATININE: 1.21 mg/dL (ref 0.61–1.24)
Chloride: 104 mmol/L (ref 98–111)
Glucose, Bld: 103 mg/dL — ABNORMAL HIGH (ref 70–99)
Potassium: 4.6 mmol/L (ref 3.5–5.1)
Sodium: 136 mmol/L (ref 135–145)
Total Protein: 6.8 g/dL (ref 6.5–8.1)

## 2018-05-23 LAB — LIPASE, BLOOD: LIPASE: 29 U/L (ref 11–51)

## 2018-05-23 MED ORDER — IBUPROFEN 800 MG PO TABS: 800.0000 mg | ORAL_TABLET | Freq: Three times a day (TID) | ORAL | 0 refills | Status: DC

## 2018-05-23 MED ORDER — IOHEXOL 300 MG/ML  SOLN
100.0000 mL | Freq: Once | INTRAMUSCULAR | Status: AC | PRN
  Administered 2018-05-23: 100 mL via INTRAVENOUS

## 2018-05-23 NOTE — ED Triage Notes (Signed)
Pt with right lower abdomen pain that has been on going. Denies N/V/D. No hx of abdominal issues. Swarm in progress.

## 2018-05-23 NOTE — ED Notes (Signed)
ED Provider at bedside. 

## 2018-05-23 NOTE — ED Notes (Signed)
Patient transported to CT 

## 2018-05-23 NOTE — ED Notes (Signed)
Signature pad not available. Patient verbalized discharge instructions.

## 2018-05-23 NOTE — ED Provider Notes (Signed)
[facility] EMERGENCY DEPARTMENT Provider Note   CSN: [id number] Arrival date & time: 05/23/18  4098     History   Chief Complaint Chief Complaint  Patient presents with  . Abdominal Pain    HPI Paul Mosley is a 37 y.o. male.  HPI Patient has been having problems with episodes of right lower quadrant pain.  He reports that it has been something that is come and gone for several months duration.  He reports over the past couple of days is become quite severe.  Yesterday evening he had trouble sleeping.  Pain is aching and sharp in quality.  He denies he is having difficulty urinating.  No pain or burning with urination.  No testicular pain.  He reports sometimes it seems worse with eating but he has not noticed it consistently.  It is also sometimes worse with movements.  He does a lot of long over the road driving in certain positions can make it worse.  No fever, no vomiting, no diarrhea.  Some radiation of pain into the lower back. Past Medical History:  Diagnosis Date  . 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 Charlean Sanfilippo 06/05/16)    Patient Active Problem List   Diagnosis Date Noted  . Hypogonadotropic hypogonadism in male [facility]) 01/27/2016    History reviewed. No pertinent surgical history.      Home Medications    Prior to Admission medications   Medication Sig Start Date End Date Taking? Authorizing Provider  aspirin 500 MG tablet Take 500 mg by mouth every 6 (six) hours as needed for pain.   Yes [provider]  benzonatate (TESSALON) 100 MG capsule Take 1 capsule (100 mg total) by mouth 3 (three) times daily as needed for cough. Patient not taking: Reported on 05/23/2018 05/25/16   Saguier, Ramon Dredge, PA-C  fluticasone [facility]) 50 MCG/ACT nasal spray Place 2 sprays into both nostrils daily. Patient not taking: Reported on 05/23/2018 05/25/16   Saguier,  Ramon Dredge, PA-C  ibuprofen (ADVIL,MOTRIN) 800 MG tablet Take 1 tablet (800 mg total) by mouth 3 (three) times daily. 05/23/18   Arby Barrette, MD    Family History Family History  Problem Relation Age of Onset  . Transient ischemic attack Brother 45    Social History Social History   Tobacco Use  . Smoking status: Never Smoker  . Smokeless tobacco: Never Used  Substance Use Topics  . Alcohol use: Yes    Comment: occassionally  . Drug use: No     Allergies   Patient has no known allergies.   Review of Systems Review of Systems 10 Systems reviewed and are negative for acute change except as noted in the HPI.   Physical Exam Updated Vital Signs BP 125/85 (BP Location: Right Arm)   Pulse (!) 57   Temp 98.4 F (36.9 C) (Oral)   Resp 16   SpO2 99%   Physical Exam  Constitutional: He is oriented to person, place, and time. He appears well-developed and well-nourished. No distress.  HENT:  Head: Normocephalic and atraumatic.  Eyes: EOM are normal.  Neck: Neck supple.  Cardiovascular: Normal rate, regular rhythm, normal heart sounds and intact distal pulses.  Pulmonary/Chest: Effort normal and breath sounds normal.  Abdominal: Soft.  Moderate pain in the right lower quadrant.  No guarding.  Palpation of the inguinal region is without mass or fullness.  Genitourinary:  Genitourinary Comments: Penis normal.  Testicles are not enlarged.  Contours are smooth.  The epididymis on the right testicle is prominent but not tender.  No scrotal fullness.  Musculoskeletal: Normal range of motion. He exhibits no edema or tenderness.  Neurological: He is alert and oriented to person, place, and time. He exhibits normal muscle tone. Coordination normal.  Skin: Skin is warm and dry.  Psychiatric: He has a normal mood and affect.     ED Treatments / Results  Labs (all labs ordered are listed, but only abnormal results are displayed) Labs Reviewed  COMPREHENSIVE METABOLIC PANEL -  Abnormal; Notable for the following components:      Result Value   Glucose, Bld 103 (*)    All other components within normal limits  CBC - Abnormal; Notable for the following components:   WBC 3.5 (*)    All other components within normal limits  LIPASE, BLOOD  URINALYSIS, ROUTINE W REFLEX MICROSCOPIC    EKG None  Radiology Ct Abdomen Pelvis W Contrast  Result Date: 05/23/2018 CLINICAL DATA:  37 year old with abdominal pain. Suspected appendicitis. EXAM: CT ABDOMEN AND PELVIS WITH CONTRAST TECHNIQUE: Multidetector CT imaging of the abdomen and pelvis was performed using the standard protocol following bolus administration of intravenous contrast. CONTRAST:  100mL OMNIPAQUE IOHEXOL 300 MG/ML  SOLN COMPARISON:  None. FINDINGS: Lower chest: Dependent densities in the lower lungs are suggestive for mild atelectasis. No pleural effusions. Hepatobiliary: Normal appearance of the liver, gallbladder and portal venous system. Incidentally, there is probably a small Phrygian cap associated with the gallbladder. No biliary dilatation. Pancreas: Unremarkable. No pancreatic ductal dilatation or surrounding inflammatory changes. Spleen: Normal in size without focal abnormality. Adrenals/Urinary Tract: Adrenal glands are unremarkable. Kidneys are normal, without renal calculi, focal lesion, or hydronephrosis. Bladder is unremarkable. Stomach/Bowel: Stomach is within normal limits. Appendix appears normal. No evidence of bowel wall thickening, distention, or inflammatory changes. Vascular/Lymphatic: No significant vascular findings are present. No enlarged abdominal or pelvic lymph nodes. Reproductive: Prostate is unremarkable. Other: Negative for free fluid.  Negative for free air. Musculoskeletal: No acute or significant osseous findings. IMPRESSION: Negative CT of the abdomen and pelvis. Specifically, normal appendix. No acute inflammatory changes in the abdomen or pelvis. Electronically Signed   By: Richarda OverlieAdam   Henn M.D.   On: 05/23/2018 12:52    Procedures Procedures (including critical care time)  Medications Ordered in ED Medications  iohexol (OMNIPAQUE) 300 MG/ML solution 100 mL (100 mLs Intravenous Contrast Given 05/23/18 1240)     Initial Impression / Assessment and Plan / ED Course  I have reviewed the triage vital signs and the nursing notes.  Pertinent labs & imaging results that were available during my care of the patient were reviewed by me and considered in my medical decision making (see chart for details).    Diagnostic work-up is within normal limits.  CT does not show acute intra-abdominal process.  Testicular exam and scrotal exam are nontender.  There is no appreciable mass or fullness to suggest a incarcerated inguinal hernia.  I do have suspicion however for possible hernia that reduces.  Patient has noted that pain is more pronounced after long episodes of driving and also after lifting.  He has not noted particularly of mass or fullness.  He is counseled on the nature of hernias and the follow-up plan.  Return precautions reviewed.  Final Clinical Impressions(s) / ED Diagnoses   Final diagnoses:  Right lower quadrant abdominal pain    ED Discharge Orders         Ordered  ibuprofen (ADVIL,MOTRIN) 800 MG tablet  3 times daily     05/23/18 1317           Arby Barrette, MD 05/23/18 1325

## 2018-05-30 ENCOUNTER — Ambulatory Visit (HOSPITAL_BASED_OUTPATIENT_CLINIC_OR_DEPARTMENT_OTHER)
Admission: RE | Admit: 2018-05-30 | Discharge: 2018-05-30 | Disposition: A | Discharge status: Home / Self Care | Payer: Self-pay | Source: Ambulatory Visit | Attending: Medical | Admitting: Medical

## 2018-05-30 ENCOUNTER — Ambulatory Visit (INDEPENDENT_AMBULATORY_CARE_PROVIDER_SITE_OTHER): Payer: Self-pay | Admitting: Medical

## 2018-05-30 ENCOUNTER — Encounter: Payer: Self-pay | Admitting: Medical

## 2018-05-30 VITALS — BP 128/86 | HR 56 | Temp 98.1°F | Resp 16 | Ht 67.0 in | Wt 177.2 lb

## 2018-05-30 DIAGNOSIS — N50819 Testicular pain, unspecified: Secondary | ICD-10-CM | POA: Insufficient documentation

## 2018-05-30 DIAGNOSIS — N451 Epididymitis: Secondary | ICD-10-CM

## 2018-05-30 DIAGNOSIS — R1031 Right lower quadrant pain: Secondary | ICD-10-CM

## 2018-05-30 MED ORDER — DICLOFENAC SODIUM 75 MG PO TBEC: 75.0000 mg | DELAYED_RELEASE_TABLET | Freq: Two times a day (BID) | ORAL | 0 refills | Status: DC

## 2018-05-30 MED ORDER — CIPROFLOXACIN HCL 500 MG PO TABS: 500.0000 mg | ORAL_TABLET | Freq: Two times a day (BID) | ORAL | 0 refills | Status: DC

## 2018-05-30 NOTE — Progress Notes (Signed)
Subjective:    Patient ID: Paul Mosley, male    DOB: 06-12-1981, 37 y.o.   MRN: [id number]  HPI  Pt in for follow up. Pt states he has been having some pain in his rt lower abdomen region. Pt in ED did have evaluation recently. He had negative ct scan for appendicitis. Also on physical exam ED did find hernia.  Pt states pain has been present for 2-3 months. Pt states he feel pain more often when he sits for long periods.  In ED urine was clear. Wbc not elevated, cmp ok and lipase was normal.  Minimal faint pain presently after sitting waiting for me. Sometimes associates pain with having full bladder.   On further discussion pt  does note some occasional intermittent pain on top of testicles. Sometimes when he has pain in testicle will have pain in upper groin area.    Review of Systems  Constitutional: Negative for chills, fatigue and fever.  Respiratory: Negative for cough, chest tightness, shortness of breath and wheezing.   Cardiovascular: Negative for chest pain.  Gastrointestinal: Negative for abdominal pain.       Rtr groin/inguinal region pain upper portion.  Genitourinary: Positive for testicular pain. Negative for difficulty urinating, dysuria, frequency, penile pain and urgency.  Musculoskeletal: Negative for back pain.  Skin: Negative for rash.  Hematological: Negative for adenopathy. Does not bruise/bleed easily.  Psychiatric/Behavioral: Negative for behavioral problems and confusion.   Past Medical History:  Diagnosis Date  . 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 Charlean SanfilippoGherghe's 06/05/16)     Social History   Socioeconomic History  . Marital status: Married    Spouse name: Not on file  . Number of children: Not on file  . Years of education: Not on file  . Highest education level: Not on file  Occupational History  . Not on file  Social Needs  . Financial resource strain:  Not on file  . Food insecurity:    Worry: Not on file    Inability: Not on file  . Transportation needs:    Medical: Not on file    Non-medical: Not on file  Tobacco Use  . Smoking status: Never Smoker  . Smokeless tobacco: Never Used  Substance and Sexual Activity  . Alcohol use: Yes    Comment: occassionally  . Drug use: No  . Sexual activity: Yes  Lifestyle  . Physical activity:    Days per week: Not on file    Minutes per session: Not on file  . Stress: Not on file  Relationships  . Social connections:    Talks on phone: Not on file    Gets together: Not on file    Attends religious service: Not on file    Active member of club or organization: Not on file    Attends meetings of clubs or organizations: Not on file    Relationship status: Not on file  . Intimate partner violence:    Fear of current or ex partner: Not on file    Emotionally abused: Not on file    Physically abused: Not on file    Forced sexual activity: Not on file  Other Topics Concern  . Not on file  Social History Narrative   Married, has 2 biologic children, 4 stepchildren.   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   From [place], lived in [place] x 5 [place]rs prior to  coming to [place] 2015.   No tob, occ alc, no drugs.    No past surgical history on file.  Family History  Problem Relation Age of Onset  . Transient ischemic attack Brother 45    No Known Allergies  Current Outpatient Medications on File Prior to Visit  Medication Sig Dispense Refill  . ibuprofen (ADVIL,MOTRIN) 800 MG tablet Take 1 tablet (800 mg total) by mouth 3 (three) times daily. 21 tablet 0   No current facility-administered medications on file prior to visit.     BP 128/86   Pulse (!) 56   Temp 98.1 F (36.7 C) (Oral)   Resp 16   Ht 5\' 7"  (1.702 m)   Wt 177 lb 3.2 oz (80.4 kg)   SpO2 100%   BMI 27.75 kg/m       Objective:   Physical Exam  General Appearance- Not in acute  distress.  HEENT Eyes- Scleraeral/Conjuntiva-bilat- Not Yellow. Mouth & Throat- Normal.  Chest and Lung Exam Auscultation: Breath sounds:-Normal. Adventitious sounds:- No Adventitious sounds.  Cardiovascular Auscultation:Rythm - Regular. Heart Sounds -Normal heart sounds.  Abdomen Inspection:-Inspection Normal.  Palpation/Perucssion: Palpation and Percussion of the abdomen reveal- Non Tender(no direct rlq tenderness presently), No Rebound tenderness, No rigidity(Guarding) and No Palpable abdominal masses.  Liver:-Normal.  Spleen:- Normal.    Genital exam- on palpation of testicles. Faint tender to palpation over rt side epididymus. Canals free from any obvious hernia.    Assessment & Plan:  You have recently had a right lower quadrant/inguinal region pain with a negative CT scan which did not show appendicitis and no hernia was found by ED MD.  On exam I did not find hernia either.  However your testicle was a little tender and you do associate intermittent testicle pain with groin region pain.  I do think you may have epididymitis with associated inguinal canal region pain.  I did send in Cipro antibiotic to your pharmacy and prescription of diclofenac NSAID.   I am placing a ultrasound order to evaluate the scrotum and testicles.  If pain worsens or changes let us know.  Any severe testicle pain after hours recommend ED evaluation.  Follow-up in 2 weeks or as needed.  Esperanza Richters, PA-C

## 2018-05-30 NOTE — Patient Instructions (Addendum)
You have recently had a right lower quadrant/inguinal region pain with a negative CT scan which did not show appendicitis and no hernia was found by ED MD.  On exam I did not find hernia either.  However your testicle was a little tender and you do associate intermittent testicle pain with groin region pain.  I do think you may have epididymitis with associated inguinal canal region pain.  I did send in Cipro antibiotic to your pharmacy and prescription of diclofenac NSAID.   I am placing a ultrasound order to evaluate the scrotum and testicles.(1:30 this afternoon)  If pain worsens or changes let us know.  Any severe testicle pain after hours recommend ED evaluation.  Follow-up in 2 weeks or as needed.

## 2018-06-13 ENCOUNTER — Encounter: Payer: Self-pay | Admitting: Medical

## 2018-06-13 ENCOUNTER — Other Ambulatory Visit: Payer: Self-pay | Admitting: Medical

## 2018-06-13 NOTE — Telephone Encounter (Signed)
I ASSME PT IS STILL HAVING SYMPTOMS. Chart reviewed. I have not seen pt in almost 2 yrs. He was seen 05/30/18 by a different [facility] provider and rx'd a course of cipro and meloxicam for lower abd pain and some unilateral testicular tenderness on exam-->epididymitis suspected.  A scrotal u/s showed no abnormalities. I am denying RF. PT NEEDS RECHECK IN OFFICE TO SEE WHAT THE NEXT STEP SHOULD BE.

## 2018-06-14 NOTE — Telephone Encounter (Signed)
Left message for pt to call back  °

## 2018-06-15 ENCOUNTER — Encounter: Payer: Self-pay | Admitting: Medical

## 2018-06-19 ENCOUNTER — Ambulatory Visit: Payer: Self-pay | Admitting: Medical

## 2019-05-08 ENCOUNTER — Encounter (HOSPITAL_COMMUNITY): Payer: Self-pay | Admitting: Emergency Medicine

## 2019-05-08 ENCOUNTER — Other Ambulatory Visit: Payer: Self-pay

## 2019-05-08 ENCOUNTER — Ambulatory Visit (HOSPITAL_COMMUNITY)
Admission: EM | Admit: 2019-05-08 | Discharge: 2019-05-08 | Disposition: A | Discharge status: Home / Self Care | Payer: Self-pay | Attending: Emergency Medicine | Admitting: Emergency Medicine

## 2019-05-08 DIAGNOSIS — M25551 Pain in right hip: Secondary | ICD-10-CM

## 2019-05-08 DIAGNOSIS — M79651 Pain in right thigh: Secondary | ICD-10-CM

## 2019-05-08 MED ORDER — CYCLOBENZAPRINE HCL 10 MG PO TABS: 10.0000 mg | ORAL_TABLET | Freq: Two times a day (BID) | ORAL | 0 refills | Status: DC | PRN

## 2019-05-08 MED ORDER — IBUPROFEN 800 MG PO TABS: 800.0000 mg | ORAL_TABLET | Freq: Three times a day (TID) | ORAL | 0 refills | Status: AC | PRN

## 2019-05-08 NOTE — ED Provider Notes (Signed)
[place]    CSN: [id number] Arrival date & time: 05/08/19  1850      History   Chief Complaint Chief Complaint  Patient presents with  . Hip Pain  . Leg Pain    HPI Paul Mosley is a 38 y.o. male.   Patient presents with right hip and right thigh pain x1 week.  He denies falls or injury.  He states he believes he strained it at work.  He denies numbness, tingling, paresthesias, abdominal pain, dysuria, rash, lesions, or other symptoms.  No treatments attempted at home.  The history is provided by the patient.    Past Medical History:  Diagnosis Date  . 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 Arman Filter 06/05/16)    Patient Active Problem List   Diagnosis Date Noted  . 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 No pertinent surgical history.     Home Medications    Prior to Admission medications   Medication Sig Start Date End Date Taking? Authorizing Provider  ciprofloxacin (CIPRO) 500 MG tablet Take 1 tablet (500 mg total) by mouth 2 (two) times daily. 05/30/18   Saguier, Percell Miller, PA-C  cyclobenzaprine (FLEXERIL) 10 MG tablet Take 1 tablet (10 mg total) by mouth 2 (two) times daily as needed for muscle spasms. 05/08/19   Sharion Balloon, NP  diclofenac (VOLTAREN) 75 MG EC tablet Take 1 tablet (75 mg total) by mouth 2 (two) times daily. 05/30/18   Saguier, Percell Miller, PA-C  ibuprofen (ADVIL) 800 MG tablet Take 1 tablet (800 mg total) by mouth every 8 (eight) hours as needed. 05/08/19   Sharion Balloon, NP    Family History Family History  Problem Relation Age of Onset  . Transient ischemic attack Brother 33    Social History Social History   Tobacco Use  . Smoking status: Never Smoker  . Smokeless tobacco: Never Used  Substance Use Topics  . Alcohol use: Yes    Comment: occassionally  . Drug use: No     Allergies   Patient has  no known allergies.   Review of Systems Review of Systems  Constitutional: Negative for chills and fever.  HENT: Negative for ear pain and sore throat.   Eyes: Negative for pain and visual disturbance.  Respiratory: Negative for cough and shortness of breath.   Cardiovascular: Negative for chest pain and palpitations.  Gastrointestinal: Negative for abdominal pain and vomiting.  Genitourinary: Negative for dysuria and hematuria.  Musculoskeletal: Positive for arthralgias and myalgias. Negative for back pain.  Skin: Negative for color change, rash and wound.  Neurological: Negative for seizures and syncope.  All other systems reviewed and are negative.    Physical Exam Triage Vital Signs ED Triage Vitals  Enc Vitals Group     BP      Pulse      Resp      Temp      Temp src      SpO2      Weight      Height      Head Circumference      Peak Flow      Pain Score      Pain Loc      Pain Edu?      Excl. in [place]?    No data found.  Updated Vital Signs BP 124/75 (BP Location: Left Arm)   Pulse 67   Temp  98.1 F (36.7 C) (Temporal)   Resp 16   SpO2 100%   Visual Acuity Right Eye Distance:   Left Eye Distance:   Bilateral Distance:    Right Eye Near:   Left Eye Near:    Bilateral Near:     Physical Exam Vitals signs and nursing note reviewed.  Constitutional:      General: He is not in acute distress.    Appearance: He is well-developed. He is not ill-appearing.  HENT:     Head: Normocephalic and atraumatic.  Eyes:     Conjunctiva/sclera: Conjunctivae normal.  Neck:     Musculoskeletal: Neck supple.  Cardiovascular:     Rate and Rhythm: Normal rate and regular rhythm.     Heart sounds: No murmur.  Pulmonary:     Effort: Pulmonary effort is normal. No respiratory distress.     Breath sounds: Normal breath sounds.  Abdominal:     General: Bowel sounds are normal.     Palpations: Abdomen is soft.     Tenderness: There is no abdominal tenderness. There is  no right CVA tenderness, left CVA tenderness, guarding or rebound.  Musculoskeletal: Normal range of motion.        General: No swelling, tenderness, deformity or signs of injury.  Skin:    General: Skin is warm and dry.     Capillary Refill: Capillary refill takes less than 2 seconds.     Findings: No bruising, erythema, lesion or rash.  Neurological:     General: No focal deficit present.     Mental Status: He is alert and oriented to person, place, and time.     Sensory: No sensory deficit.     Motor: No weakness.     Gait: Gait normal.      UC Treatments / Results  Labs (all labs ordered are listed, but only abnormal results are displayed) Labs Reviewed - No data to display  EKG   Radiology No results found.  Procedures Procedures (including critical care time)  Medications Ordered in UC Medications - No data to display  Initial Impression / Assessment and Plan / UC Course  I have reviewed the triage vital signs and the nursing notes.  Pertinent labs & imaging results that were available during my care of the patient were reviewed by me and considered in my medical decision making (see chart for details).    Right hip and thigh pain.  Patient is well-appearing and his exam is unremarkable.  Treating with ibuprofen and Flexeril.  Precautions for drowsiness with Flexeril discussed with patient.  Instructed him to return here or follow-up with his PCP if his pain is not improving or gets worse.  Discussed that he also should follow-up right away if he develops new symptoms such as weakness, numbness, paresthesias, bowel/bladder incontinence, dysuria, or other concerning symptoms.  Patient agrees to plan of care.     Final Clinical Impressions(s) / UC Diagnoses   Final diagnoses:  Right hip pain  Right thigh pain     Discharge Instructions     Take the prescribed ibuprofen as needed for your pain.  Take the muscle relaxer Flexeril as needed for muscle spasm; do  not drive, operate machinery, or drink alcohol with this medication as it may make you drowsy.    Return here or follow up with your primary care provider if your pain is not improving or gets worse; or if you develop new symptoms such as difficulty with urination, weakness, numbness,  loss of control of your bladder or bowels, fever, or chills.       ED Prescriptions    Medication Sig Dispense Auth. Provider   ibuprofen (ADVIL) 800 MG tablet Take 1 tablet (800 mg total) by mouth every 8 (eight) hours as needed. 21 tablet Mickie Bail, NP   cyclobenzaprine (FLEXERIL) 10 MG tablet Take 1 tablet (10 mg total) by mouth 2 (two) times daily as needed for muscle spasms. 20 tablet Mickie Bail, NP     PDMP not reviewed this encounter.   Mickie Bail, NP 05/08/19 [phone]

## 2019-05-08 NOTE — Discharge Instructions (Addendum)
Take the prescribed ibuprofen as needed for your pain.  Take the muscle relaxer Flexeril as needed for muscle spasm; do not drive, operate machinery, or drink alcohol with this medication as it may make you drowsy.   ° °Return here or follow up with your primary care provider if your pain is not improving or gets worse; or if you develop new symptoms such as difficulty with urination, weakness, numbness, loss of control of your bladder or bowels, fever, or chills.   ° °

## 2019-05-08 NOTE — ED Triage Notes (Signed)
For a week has had pain intermittently in right thigh, deep in thigh.  Sometimes has pain in right hip/buttocks and into right thigh.    Denies injury.   Patient has started a new job approximately one week prior to this complaint

## 2019-10-27 ENCOUNTER — Ambulatory Visit
Admission: EM | Admit: 2019-10-27 | Discharge: 2019-10-27 | Disposition: A | Discharge status: Home / Self Care | Payer: Self-pay

## 2019-10-27 ENCOUNTER — Encounter: Payer: Self-pay | Admitting: Emergency Medicine

## 2019-10-27 ENCOUNTER — Other Ambulatory Visit: Payer: Self-pay

## 2019-10-27 DIAGNOSIS — J069 Acute upper respiratory infection, unspecified: Secondary | ICD-10-CM

## 2019-10-27 DIAGNOSIS — Z20822 Contact with and (suspected) exposure to covid-19: Secondary | ICD-10-CM

## 2019-10-27 MED ORDER — FLUTICASONE PROPIONATE 50 MCG/ACT NA SUSP: 1.0000 | Freq: Every day | NASAL | 0 refills | Status: DC

## 2019-10-27 MED ORDER — NEOMYCIN-POLYMYXIN-HC 3.5-10000-1 OT SOLN: 3.0000 [drp] | Freq: Three times a day (TID) | OTIC | 0 refills | Status: AC

## 2019-10-27 MED ORDER — OLOPATADINE HCL 0.2 % OP SOLN: 1.0000 [drp] | Freq: Every day | OPHTHALMIC | 0 refills | Status: DC

## 2019-10-27 MED ORDER — BENZONATATE 100 MG PO CAPS: 100.0000 mg | ORAL_CAPSULE | Freq: Three times a day (TID) | ORAL | 0 refills | Status: DC

## 2019-10-27 NOTE — Discharge Instructions (Addendum)

## 2019-10-27 NOTE — ED Provider Notes (Signed)
[facility]    CSN: [id number] Arrival date & time: 10/27/19  1023      History   Chief Complaint Chief Complaint  Patient presents with  . URI    HPI Paul Mosley is a 39 y.o. male with history of hypergonadism presenting for week course of bilateral itchy/watery eyes, itchy ears, mild dry cough, significant nasal congestion with rhinorrhea, postnasal drip, sore throat.  Patient states he underwent Covid testing on Saturday at [facility]: Unsure of results.  Has tried Zyrtec, Mucinex, Benadryl with mild relief.  States nasal congestion is worse at night when laying down.  Denies fever, arthralgias, myalgias, difficulty breathing, chest pain, nausea, vomiting, abdominal pain, diarrhea.  No known sick contacts.   Past Medical History:  Diagnosis Date  . 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 Arman Filter 06/05/16)    Patient Active Problem List   Diagnosis Date Noted  . 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 No pertinent surgical history.     Home Medications    Prior to Admission medications   Medication Sig Start Date End Date Taking? Authorizing Provider  cetirizine (ZYRTEC) 10 MG tablet Take 10 mg by mouth daily.   Yes [provider]  diphenhydrAMINE (BENADRYL) 25 mg capsule Take 25 mg by mouth every 6 (six) hours as needed.   Yes [provider]  Pseudoephedrine-APAP-DM (DAYQUIL PO) Take by mouth.   Yes [provider]  benzonatate (TESSALON) 100 MG capsule Take 1 capsule (100 mg total) by mouth every 8 (eight) hours. 10/27/19   Hall-Potvin, [place], PA-C  fluticasone (FLONASE) 50 MCG/ACT nasal spray Place 1 spray into both nostrils daily. 10/27/19   Hall-Potvin, [place], PA-C  ibuprofen (ADVIL) 800 MG tablet Take 1 tablet (800 mg total) by mouth every 8 (eight) hours as needed. 05/08/19   Sharion Balloon, NP    neomycin-polymyxin-hydrocortisone (CORTISPORIN) OTIC solution Place 3 drops into both ears 3 (three) times daily for 7 days. 10/27/19 11/03/19  Hall-Potvin, [place], PA-C  Olopatadine HCl 0.2 % SOLN Apply 1 drop to eye daily. 10/27/19   Hall-Potvin, [place], PA-C    Family History Family History  Problem Relation Age of Onset  . Transient ischemic attack Brother 49    Social History Social History   Tobacco Use  . Smoking status: Never Smoker  . Smokeless tobacco: Never Used  Substance Use Topics  . Alcohol use: Yes    Comment: occassionally  . Drug use: No     Allergies   Patient has no known allergies.   Review of Systems As per HPI   Physical Exam Triage Vital Signs ED Triage Vitals  Enc Vitals Group     BP 10/27/19 1111 118/75     Pulse Rate 10/27/19 1111 75     Resp 10/27/19 1111 18     Temp 10/27/19 1111 97.9 F (36.6 C)     Temp Source 10/27/19 1111 Oral     SpO2 10/27/19 1111 96 %     Weight --      Height --      Head Circumference --      Peak Flow --      Pain Score 10/27/19 1107 0     Pain Loc --      Pain Edu? --      Excl. in [place]? --    No data found.  Updated Vital Signs BP 118/75 (BP  Location: Right Arm)   Pulse 75   Temp 97.9 F (36.6 C) (Oral)   Resp 18   SpO2 96%   Visual Acuity Right Eye Distance:   Left Eye Distance:   Bilateral Distance:    Right Eye Near:   Left Eye Near:    Bilateral Near:     Physical Exam Constitutional:      General: He is not in acute distress.    Appearance: He is not toxic-appearing.  HENT:     Head: Normocephalic and atraumatic.     Right Ear: Tympanic membrane and external ear normal.     Left Ear: Tympanic membrane and external ear normal.     Ears:     Comments: Bilateral EAC erythema that spares TM.  No discharge, foreign body    Nose: No nasal deformity, congestion or rhinorrhea.     Comments: Bilateral turbinate edema with mucosal pallor.  Clear rhinorrhea noted.  Negative sinus  tenderness bilaterally.    Mouth/Throat:     Mouth: Mucous membranes are moist.     Tongue: Tongue does not deviate from midline.     Pharynx: Oropharynx is clear. Uvula midline.     Comments: Cobblestoning present.  No tonsillar hypertrophy or exudate Eyes:     General: No scleral icterus.    Conjunctiva/sclera: Conjunctivae normal.     Pupils: Pupils are equal, round, and reactive to light.  Cardiovascular:     Rate and Rhythm: Normal rate and regular rhythm.  Pulmonary:     Effort: Pulmonary effort is normal. No respiratory distress.     Breath sounds: No stridor. No wheezing or rales.  Musculoskeletal:        General: Normal range of motion.     Cervical back: Normal range of motion and neck supple. No tenderness. No muscular tenderness.     Right lower leg: No edema.     Left lower leg: No edema.  Lymphadenopathy:     Cervical: No cervical adenopathy.  Skin:    General: Skin is warm.     Capillary Refill: Capillary refill takes less than 2 seconds.     Coloration: Skin is not jaundiced or pale.     Findings: No rash.  Neurological:     Mental Status: He is alert.      UC Treatments / Results  Labs (all labs ordered are listed, but only abnormal results are displayed) Labs Reviewed  NOVEL CORONAVIRUS, NAA    EKG   Radiology No results found.  Procedures Procedures (including critical care time)  Medications Ordered in UC Medications - No data to display  Initial Impression / Assessment and Plan / UC Course  I have reviewed the triage vital signs and the nursing notes.  Pertinent labs & imaging results that were available during my care of the patient were reviewed by me and considered in my medical decision making (see chart for details).     Patient afebrile, nontoxic, with SpO2 96%.  Covid PCR pending.  Patient to quarantine until results are back.  We will treat supportively as outlined below.  Return precautions discussed, patient verbalized  understanding and is agreeable to plan. Final Clinical Impressions(s) / UC Diagnoses   Final diagnoses:  URI with cough and congestion     Discharge Instructions     Tessalon for cough. Start flonase, atrovent nasal spray for nasal congestion/drainage. You can use over the counter nasal saline rinse such as neti pot for nasal congestion. Keep hydrated,  your urine should be clear to pale yellow in color. Tylenol/motrin for fever and pain. Monitor for any worsening of symptoms, chest pain, shortness of breath, wheezing, swelling of the throat, go to the emergency department for further evaluation needed.     ED Prescriptions    Medication Sig Dispense Auth. Provider   benzonatate (TESSALON) 100 MG capsule Take 1 capsule (100 mg total) by mouth every 8 (eight) hours. 21 capsule Hall-Potvin, [place], PA-C   fluticasone (FLONASE) 50 MCG/ACT nasal spray Place 1 spray into both nostrils daily. 16 g Hall-Potvin, [place], PA-C   Olopatadine HCl 0.2 % SOLN Apply 1 drop to eye daily. 2.5 mL Hall-Potvin, [place], PA-C   neomycin-polymyxin-hydrocortisone (CORTISPORIN) OTIC solution Place 3 drops into both ears 3 (three) times daily for 7 days. 10 mL Hall-Potvin, [place], PA-C     PDMP not reviewed this encounter.   Hall-Potvin, [place], [place] 10/27/19 1142

## 2019-10-27 NOTE — ED Triage Notes (Addendum)
For one week has had itchy, watery eyes, cough, sinus stuffiness, congestion, runny nose.

## 2019-10-27 NOTE — ED Notes (Signed)
covid test done on Saturday 4/17 at [address].  Unknown result

## 2019-10-28 LAB — NOVEL CORONAVIRUS, NAA: SARS-CoV-2, NAA: NOT DETECTED

## 2019-10-28 LAB — SARS-COV-2, NAA 2 DAY TAT

## 2020-03-10 ENCOUNTER — Other Ambulatory Visit: Payer: Self-pay | Admitting: Chiropractic Medicine

## 2020-03-10 ENCOUNTER — Ambulatory Visit
Admission: RE | Admit: 2020-03-10 | Discharge: 2020-03-10 | Disposition: A | Discharge status: Home / Self Care | Payer: BLUE CROSS/BLUE SHIELD | Source: Ambulatory Visit | Attending: Chiropractic Medicine | Admitting: Chiropractic Medicine

## 2020-03-10 ENCOUNTER — Other Ambulatory Visit: Payer: Self-pay

## 2020-03-10 DIAGNOSIS — R52 Pain, unspecified: Secondary | ICD-10-CM

## 2020-06-25 ENCOUNTER — Ambulatory Visit: Payer: BLUE CROSS/BLUE SHIELD

## 2020-07-16 ENCOUNTER — Ambulatory Visit: Payer: BLUE CROSS/BLUE SHIELD

## 2020-07-16 ENCOUNTER — Ambulatory Visit: Payer: BC Managed Care – PPO | Attending: Internal Medicine

## 2020-07-16 DIAGNOSIS — Z23 Encounter for immunization: Secondary | ICD-10-CM

## 2020-07-16 NOTE — Progress Notes (Signed)
   Covid-19 Vaccination Clinic  Name:  Paul Mosley    MRN: [id number] DOB: 1980-12-04  07/16/2020  Paul Mosley was observed post Covid-19 immunization for 15 minutes without incident. He was provided with Vaccine Information Sheet and instruction to access the V-Safe system.   Paul Mosley was instructed to call 911 with any severe reactions post vaccine: Marland Kitchen Difficulty breathing  . Swelling of face and throat  . A fast heartbeat  . A bad rash all over body  . Dizziness and weakness   Immunizations Administered    Name Date Dose VIS Date Route   Moderna COVID-19 Vaccine 07/16/2020  5:24 PM 0.5 mL 04/28/2020 Intramuscular   Manufacturer: Gala Murdoch   Lot: [id number]   NDC: 94496-759-16

## 2020-07-17 ENCOUNTER — Ambulatory Visit: Payer: BLUE CROSS/BLUE SHIELD

## 2021-04-12 IMAGING — CR DG CERVICAL SPINE COMPLETE 4+V
5 series · 5 of 5 positions shown · non-contrast
Comparison: None.

CLINICAL DATA: Back pain X 6 months

EXAM:
CERVICAL SPINE - COMPLETE 4+ VIEW

[w cervical spine ap_obl (1 of 2)]
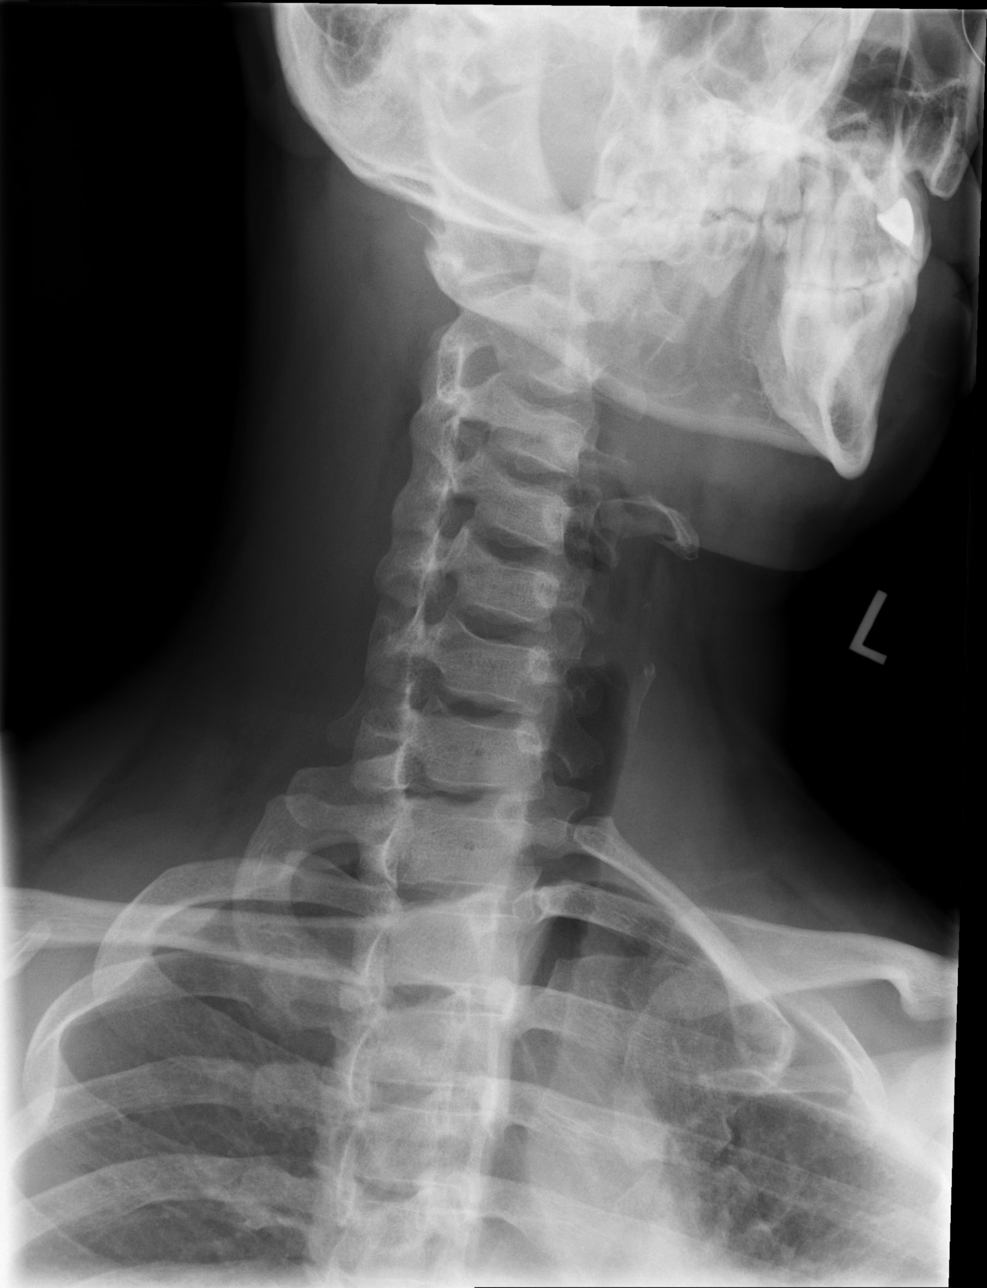

[w cervical spine ap_obl (2 of 2)]
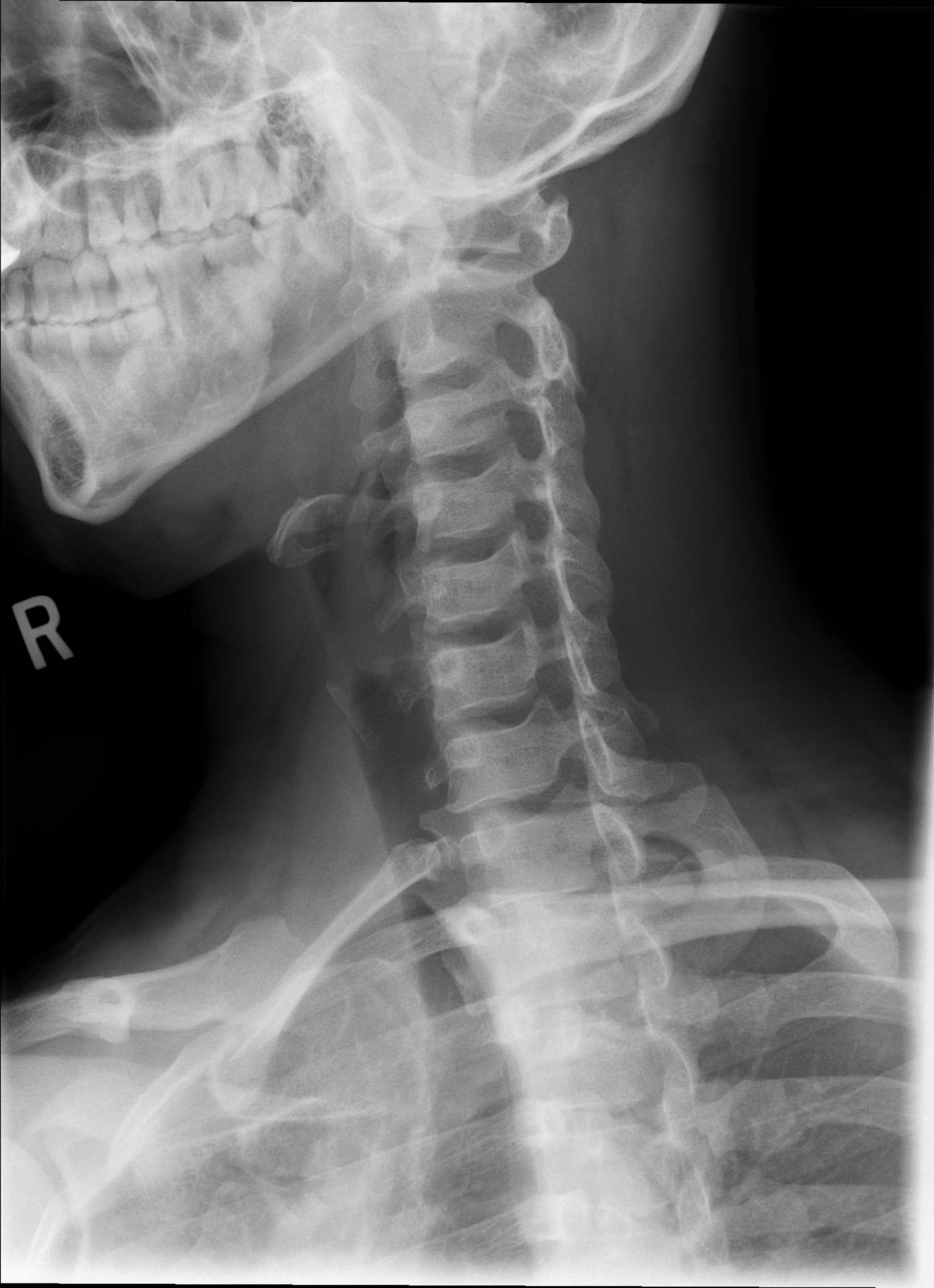

[w cervical spine ap]
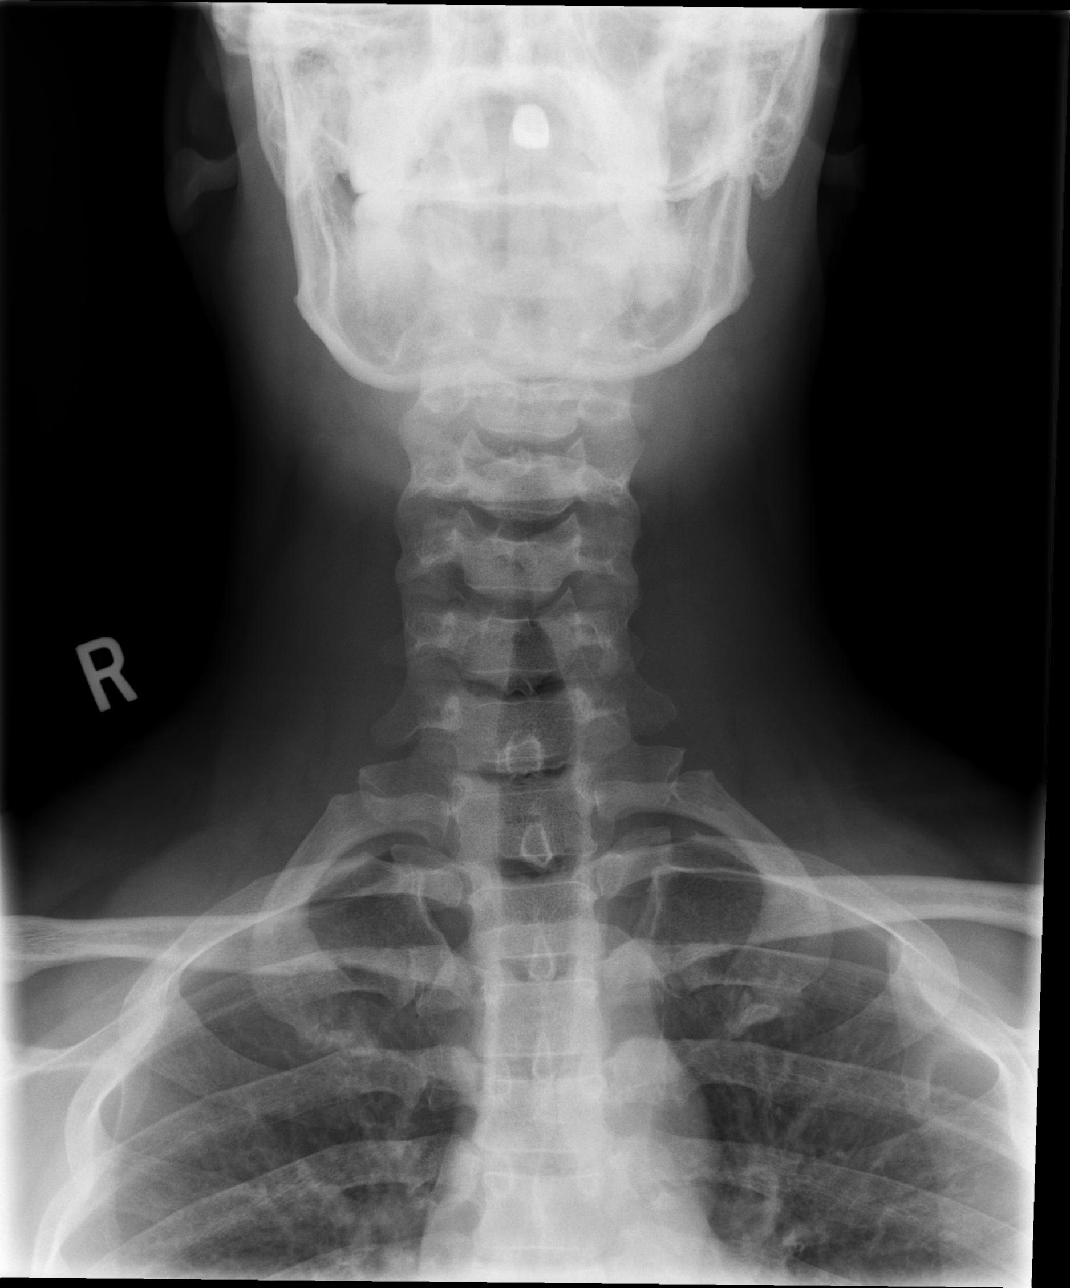

[w cervical spine odontoid]
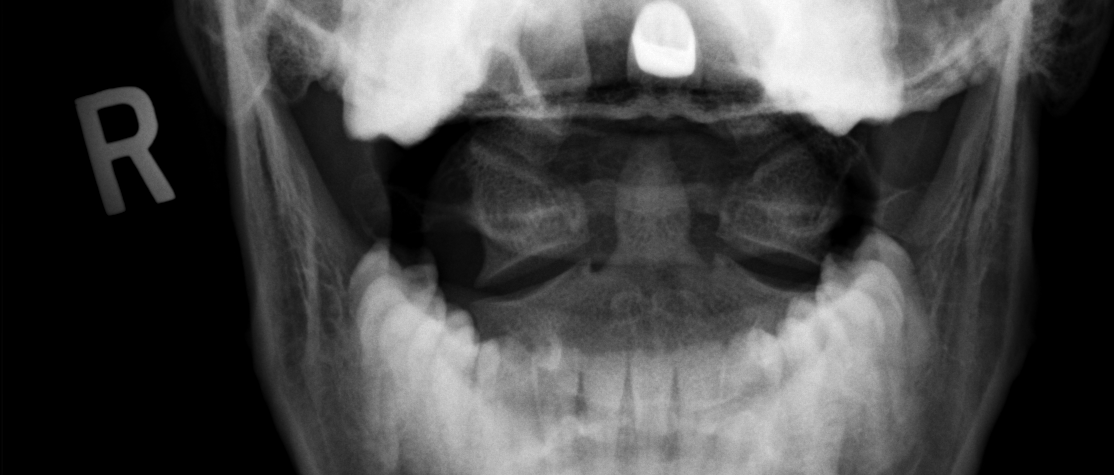

[w cervical spine lat]
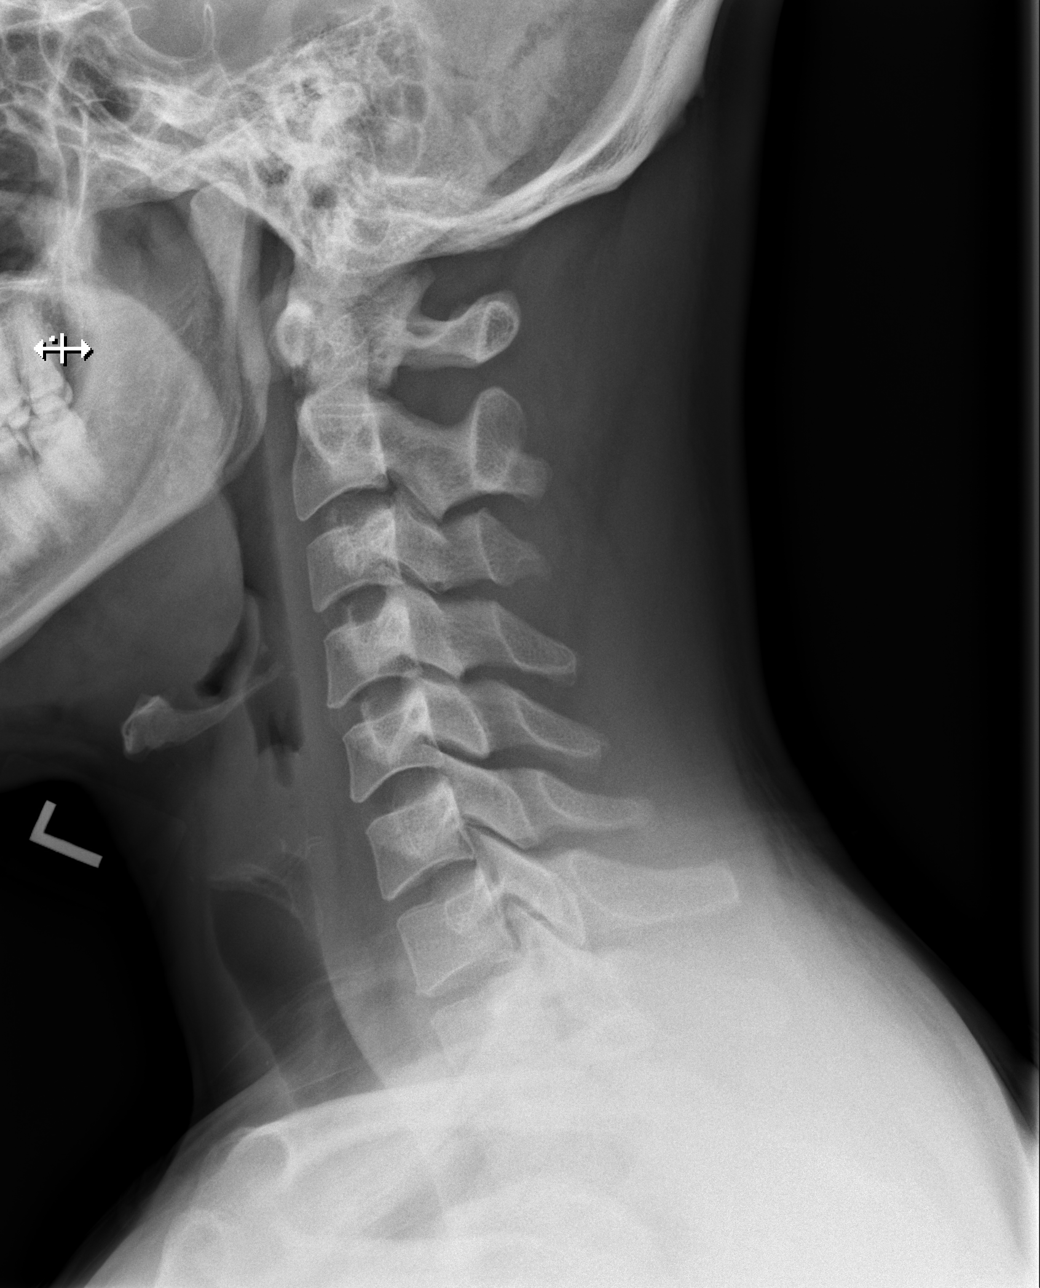

[5 of 5 positions shown; findings below may reference images not displayed]

FINDINGS: There is no evidence of cervical spine fracture or prevertebral soft
tissue swelling. Alignment is normal. No other significant bone
abnormalities are identified.
IMPRESSION: Negative cervical spine radiographs.

## 2021-04-12 IMAGING — CR DG PELVIS 1-2V
1 series · 1 of 1 positions shown · non-contrast
Comparison: None.

CLINICAL DATA: Back pain X 6 months

EXAM:
PELVIS - 1-2 VIEW

[w pelvis upright]
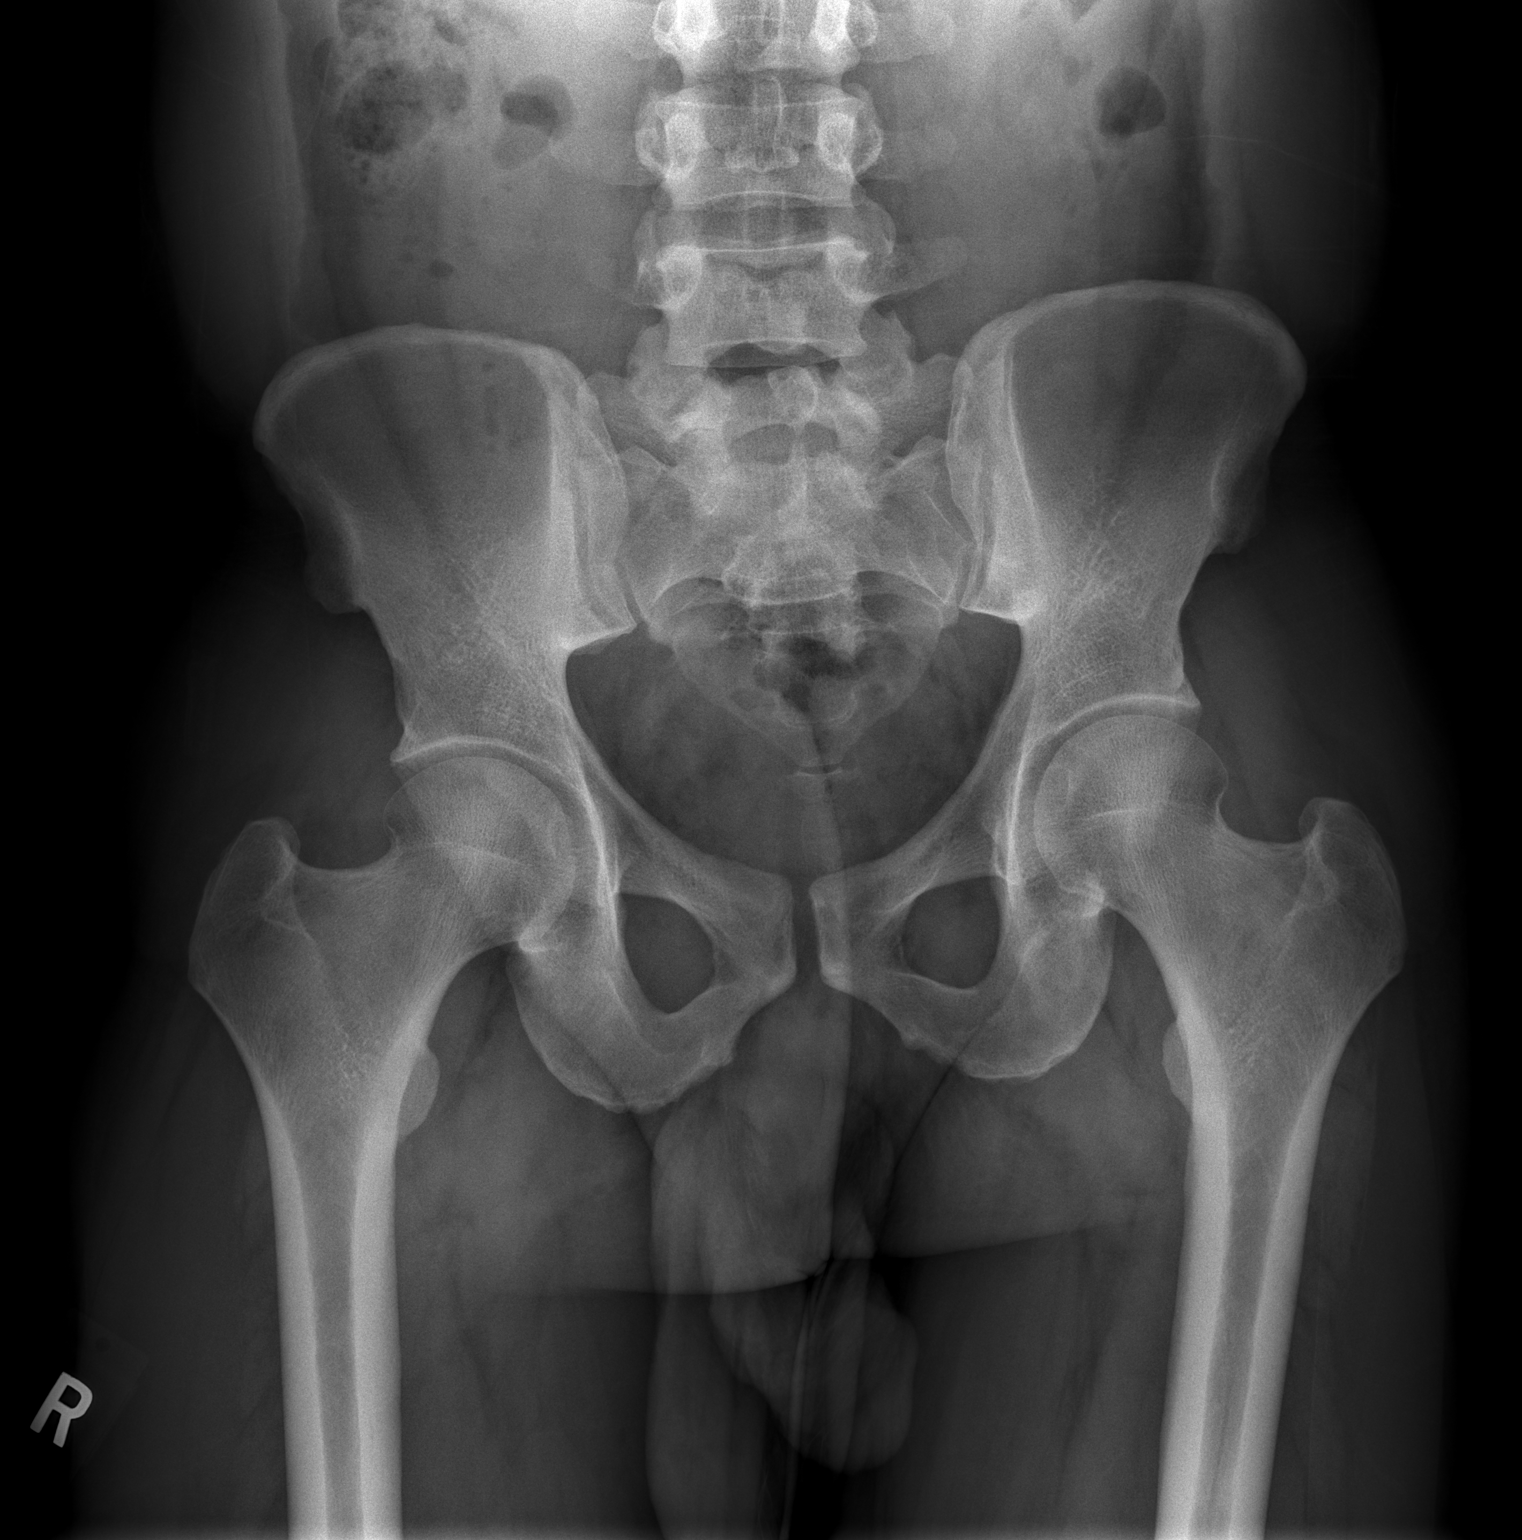

[1 of 1 positions shown; findings below may reference images not displayed]

FINDINGS: Single frontal view of the pelvis. There is no evidence of pelvic
fracture or diastasis. No pelvic bone lesions are seen.
IMPRESSION: Negative.

## 2022-08-17 ENCOUNTER — Ambulatory Visit
Admission: RE | Admit: 2022-08-17 | Discharge: 2022-08-17 | Disposition: A | Discharge status: Home / Self Care | Payer: Medicaid Other | Source: Ambulatory Visit | Attending: Physician Assistant | Admitting: Physician Assistant

## 2022-08-17 ENCOUNTER — Ambulatory Visit (INDEPENDENT_AMBULATORY_CARE_PROVIDER_SITE_OTHER): Payer: Medicaid Other

## 2022-08-17 VITALS — BP 125/86 | HR 61 | Temp 98.1°F | Resp 14

## 2022-08-17 DIAGNOSIS — R059 Cough, unspecified: Secondary | ICD-10-CM

## 2022-08-17 DIAGNOSIS — J209 Acute bronchitis, unspecified: Secondary | ICD-10-CM

## 2022-08-17 MED ORDER — PREDNISONE 20 MG PO TABS: 40.0000 mg | ORAL_TABLET | Freq: Every day | ORAL | 0 refills | Status: AC

## 2022-08-17 NOTE — ED Provider Notes (Signed)
[facility]    CSN: [id number] Arrival date & time: 08/17/22  1102      History   Chief Complaint Chief Complaint  Patient presents with   Cough   Otalgia    HPI Paul Mosley is a 42 y.o. male.   Patient here today for evaluation of dry cough he has had for the last 2 weeks. He reports that he has started to have some pain with coughing in his bilateral lower chest area. He has been taking mucinex with minimal relief. He denies fever, nausea, vomiting, diarrhea. He reports some recent sore throat.   The history is provided by the patient.    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 No pertinent surgical history.     Home Medications    Prior to Admission medications   Medication Sig Start Date End Date Taking? Authorizing Provider  predniSONE (DELTASONE) 20 MG tablet Take 2 tablets (40 mg total) by mouth daily with breakfast for 5 days. 08/17/22 08/22/22 Yes Francene Finders, PA-C  ibuprofen (ADVIL) 800 MG tablet Take 1 tablet (800 mg total) by mouth every 8 (eight) hours as needed. 05/08/19   Sharion Balloon, NP    Family History Family History  Problem Relation Age of Onset   Transient ischemic attack Brother 83    Social History Social History   Tobacco Use   Smoking status: Never   Smokeless tobacco: Never  Substance Use Topics   Alcohol use: Yes    Comment: occassionally   Drug use: No     Allergies   Patient has no known allergies.   Review of Systems Review of Systems  Constitutional:  Negative for chills and fever.  HENT:  Positive for congestion and sore throat.   Eyes:  Negative for discharge and redness.  Respiratory:  Positive for cough. Negative for  shortness of breath.   Gastrointestinal:  Negative for abdominal pain, diarrhea, nausea and vomiting.     Physical Exam Triage Vital Signs ED Triage Vitals  Enc Vitals Group     BP      Pulse      Resp      Temp      Temp src      SpO2      Weight      Height      Head Circumference      Peak Flow      Pain Score      Pain Loc      Pain Edu?      Excl. in [place]?    No data found.  Updated Vital Signs BP 125/86 (BP Location: Left Arm)   Pulse 61   Temp 98.1 F (36.7 C) (Oral)   Resp 14   SpO2 97%      Physical Exam Vitals and nursing note reviewed.  Constitutional:      General: He is not in acute distress.    Appearance: Normal appearance. He is not ill-appearing.  HENT:     Head: Normocephalic and atraumatic.     Nose: Nose normal. No congestion.     Mouth/Throat:     Mouth: Mucous membranes are moist.     Pharynx: Oropharynx is clear. No  oropharyngeal exudate or posterior oropharyngeal erythema.  Eyes:     Conjunctiva/sclera: Conjunctivae normal.  Cardiovascular:     Rate and Rhythm: Normal rate and regular rhythm.     Heart sounds: Normal heart sounds. No murmur heard. Pulmonary:     Effort: Pulmonary effort is normal. No respiratory distress.     Breath sounds: Normal breath sounds. No wheezing, rhonchi or rales.  Skin:    General: Skin is warm and dry.  Neurological:     Mental Status: He is alert.  Psychiatric:        Mood and Affect: Mood normal.        Thought Content: Thought content normal.      UC Treatments / Results  Labs (all labs ordered are listed, but only abnormal results are displayed) Labs Reviewed - No data to display  EKG   Radiology DG Chest 2 View  Result Date: 08/17/2022 CLINICAL DATA:  Cough 2 weeks. EXAM: CHEST - 2 VIEW COMPARISON:  None Available. FINDINGS: Lungs are adequately inflated without focal airspace consolidation or effusion. Cardiomediastinal silhouette is normal. Bones and soft tissues are unremarkable per  IMPRESSION: No active cardiopulmonary disease. Electronically Signed   By: Marin Olp M.D.   On: 08/17/2022 11:36    Procedures Procedures (including critical care time)  Medications Ordered in UC Medications - No data to display  Initial Impression / Assessment and Plan / UC Course  I have reviewed the triage vital signs and the nursing notes.  Pertinent labs & imaging results that were available during my care of the patient were reviewed by me and considered in my medical decision making (see chart for details).    CXR normal- suspect likely bronchitis and will treat with steroid burst. Recommend follow up if no gradual improvement or with any further concerns.   Final Clinical Impressions(s) / UC Diagnoses   Final diagnoses:  Acute bronchitis, unspecified organism   Discharge Instructions   None    ED Prescriptions     Medication Sig Dispense Auth. Provider   predniSONE (DELTASONE) 20 MG tablet Take 2 tablets (40 mg total) by mouth daily with breakfast for 5 days. 10 tablet Francene Finders, PA-C      PDMP not reviewed this encounter.   Francene Finders, PA-C 08/17/22 [phone]

## 2022-08-17 NOTE — ED Triage Notes (Addendum)
Pt reports cough that is dry for about 2 weeks. Taking Mucinex, reports pains with coughing. Pt adds that started having left ear pain yesterday

## 2022-10-31 NOTE — Progress Notes (Signed)
Subjective:    Paul Mosley - 42 y.o. male MRN [id number]  Date of birth: 19-Jan-1981  HPI  Paul Mosley is to establish care. He is accompanied by his wife.  Current issues and/or concerns: - Insomnia for years. Reports he is unsure of primary cause. Reports he is able to get to sleep but usually awakens around 3:00 am and then unable to go back to sleep. He does not consume excessive caffeine. Endorses he does need to decrease screen time. He tried over-the-counter Melatonin with no relief.  - Allergies caused primarily by environmental allergens. Endorses itchy eyes, runny nose, throat itching, and sneezing. He tried over-the-counter Mucinex, eye drops, and lemon ginger tea with no relief. Also, concern for intermittent hives of back/side. Thinks may be related to pork. He declines testing today of the same. He would like referral to specialist for further evaluation.  - Snoring during sleep. Reports the same makes sleep uncomfortable. - No further issues/concerns for discussion today.   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 He has never used smokeless tobacco. He reports current alcohol use. He reports that he does not use drugs.   Family History  family history includes Transient ischemic attack (age of onset: 24) in his brother.   Medications: reviewed and updated   Objective:   Physical Exam BP 127/86   Pulse 61   Resp 16   Ht 5\' 7"  (1.702 m)   Wt 184 lb 9.6 oz (83.7 kg)   SpO2 97%   BMI 28.91 kg/m   Physical Exam HENT:     Head: Normocephalic and atraumatic.  Eyes:     Extraocular  Movements: Extraocular movements intact.     Conjunctiva/sclera: Conjunctivae normal.     Pupils: Pupils are equal, round, and reactive to light.  Cardiovascular:     Rate and Rhythm: Normal rate and regular rhythm.     Pulses: Normal pulses.     Heart sounds: Normal heart sounds.  Pulmonary:     Effort: Pulmonary effort is normal.     Breath sounds: Normal breath sounds.  Musculoskeletal:     Cervical back: Normal range of motion and neck supple.  Neurological:     General: No focal deficit present.     Mental Status: He is alert and oriented to person, place, and time.  Psychiatric:        Mood and Affect: Mood normal.        Behavior: Behavior normal.       Assessment & Plan:  1. Encounter to establish care - Patient presents today to establish care. During the interim follow-up with primary provider as scheduled.  - Return for annual physical examination, labs, and health maintenance. Arrive fasting meaning having no food for at least 8 hours prior to appointment. You may have only water or black coffee. Please take scheduled medications as normal.  2. Insomnia, unspecified type - Trazodone as prescribed. Counseled on medication adherence/adverse effects.  - Follow-up with primary provider in 4 weeks or sooner if needed.  - traZODone (DESYREL)  50 MG tablet; Take 1 tablet (50 mg total) by mouth at bedtime.  Dispense: 30 tablet; Refill: 0  3. Snoring - PSG Sleep Study for further evaluation/management.  - PSG Sleep Study; Future  4. Perennial allergic rhinitis 5. Hives - Cetirizine as prescribed. Counseled on medication adherence/adverse effects.  - Referral to Allergy for further evaluation/management. During the interim follow-up with primary provider as scheduled.  - cetirizine (ZYRTEC) 10 MG tablet; Take 1 tablet (10 mg total) by mouth daily.  Dispense: 30 tablet; Refill: 2 - Ambulatory referral to Allergy   Patient was given clear instructions to go to Emergency  Department or return to medical center if symptoms don't improve, worsen, or new problems develop.The patient verbalized understanding.  I discussed the assessment and treatment plan with the patient. The patient was provided an opportunity to ask questions and all were answered. The patient agreed with the plan and demonstrated an understanding of the instructions.   The patient was advised to call back or seek an in-person evaluation if the symptoms worsen or if the condition fails to improve as anticipated.    Ricky Stabs, NP 11/10/2022, 10:19 AM Primary Care at [facility]

## 2022-11-09 ENCOUNTER — Ambulatory Visit: Payer: Medicaid Other | Admitting: Family Medicine

## 2022-11-10 ENCOUNTER — Encounter: Payer: Self-pay | Admitting: Family

## 2022-11-10 ENCOUNTER — Ambulatory Visit (INDEPENDENT_AMBULATORY_CARE_PROVIDER_SITE_OTHER): Payer: Medicaid Other | Admitting: Family

## 2022-11-10 VITALS — BP 127/86 | HR 61 | Resp 16 | Ht 67.0 in | Wt 184.6 lb

## 2022-11-10 DIAGNOSIS — Z7689 Persons encountering health services in other specified circumstances: Secondary | ICD-10-CM | POA: Diagnosis not present

## 2022-11-10 DIAGNOSIS — R0683 Snoring: Secondary | ICD-10-CM

## 2022-11-10 DIAGNOSIS — J3089 Other allergic rhinitis: Secondary | ICD-10-CM | POA: Diagnosis not present

## 2022-11-10 DIAGNOSIS — G4700 Insomnia, unspecified: Secondary | ICD-10-CM

## 2022-11-10 DIAGNOSIS — L509 Urticaria, unspecified: Secondary | ICD-10-CM

## 2022-11-10 MED ORDER — CETIRIZINE HCL 10 MG PO TABS: 10.0000 mg | ORAL_TABLET | Freq: Every day | ORAL | 2 refills | Status: DC

## 2022-11-10 MED ORDER — TRAZODONE HCL 50 MG PO TABS: 50.0000 mg | ORAL_TABLET | Freq: Every day | ORAL | 0 refills | Status: DC

## 2022-11-10 NOTE — Patient Instructions (Signed)
Thank you for choosing Primary Care at [facility] for your medical home!    Paul Mosley was seen by Rema Fendt, NP today.   Aleda Grana primary care provider is Ricky Stabs, NP.   For the best care possible,  you should try to see Ricky Stabs, NP whenever you come to office.   We look forward to seeing you again soon!  If you have any questions about your visit today,  please call us at [phone]  Or feel free to reach your provider via MyChart.   Keeping you healthy   Get these tests Blood pressure- Have your blood pressure checked once a year by your healthcare provider.  Normal blood pressure is 120/80. Weight- Have your body mass index (BMI) calculated to screen for obesity.  BMI is a measure of body fat based on height and weight. You can also calculate your own BMI at https://www.west-esparza.com/. Cholesterol- Have your cholesterol checked regularly starting at age 46, sooner may be necessary if you have diabetes, high blood pressure, if a family member developed heart diseases at an early age or if you smoke.  Chlamydia, HIV, and other sexual transmitted disease- Get screened each year until the age of 64 then within three months of each new sexual partner. Diabetes- Have your blood sugar checked regularly if you have high blood pressure, high cholesterol, a family history of diabetes or if you are overweight.   Get these vaccines Flu shot- Every fall. Tetanus shot- Every 10 years. Menactra- Single dose; prevents meningitis.   Take these steps Don't smoke- If you do smoke, ask your healthcare provider about quitting. For tips on how to quit, go to www.smokefree.gov or call 1-800-QUIT-NOW. Be physically active- Exercise 5 days a week for at least 30 minutes.  If you are not already physically active start slow and gradually work up to 30 minutes of moderate physical activity.  Examples of moderate activity include walking briskly, mowing the yard, dancing, swimming  bicycling, etc. Eat a healthy diet- Eat a variety of healthy foods such as fruits, vegetables, low fat milk, low fat cheese, yogurt, lean meats, poultry, fish, beans, tofu, etc.  For more information on healthy eating, go to www.thenutritionsource.org Drink alcohol in moderation- Limit alcohol intake two drinks or less a day.  Never drink and drive. Dentist- Brush and floss teeth twice daily; visit your dentis twice a year. Depression-Your emotional health is as important as your physical health.  If you're feeling down, losing interest in things you normally enjoy please talk with your healthcare provider. Gun Safety- If you keep a gun in your home, keep it unloaded and with the safety lock on.  Bullets should be stored separately. Helmet use- Always wear a helmet when riding a motorcycle, bicycle, rollerblading or skateboarding. Safe sex- If you may be exposed to a sexually transmitted infection, use a condom Seat belts- Seat bels can save your life; always wear one. Smoke/Carbon Monoxide detectors- These detectors need to be installed on the appropriate level of your home.  Replace batteries at least once a year. Skin Cancer- When out in the sun, cover up and use sunscreen SPF 15 or higher. Violence- If anyone is threatening or hurting you, please tell your healthcare provider.

## 2022-11-10 NOTE — Progress Notes (Signed)
Pt is wanting to discuss insomnia

## 2022-12-22 NOTE — Progress Notes (Unsigned)
Patient ID: Virat Distad, male    DOB: 10/30/1980  MRN: [id number]  CC: Annual Physical Exam  Subjective: Kristie Gonzalo is a 42 y.o. male who presents for annual physical exam.   His concerns today include:  Reports history of low testosterone. Reports he did not receive treatment for the same. Would like testosterone rechecked today.   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 (Patient not taking: Reported on 12/25/2022)     cetirizine (ZYRTEC) 10 MG tablet Take 1 tablet (10 mg total) by mouth daily. (Patient not taking: Reported on 12/25/2022) 30 tablet 2   ibuprofen (ADVIL) 800 MG tablet Take 1 tablet (800 mg total) by mouth every 8 (eight) hours as needed. (Patient not taking: Reported on 11/10/2022) 21 tablet 0   traZODone (DESYREL) 50 MG tablet Take 1 tablet (50 mg total) by mouth at bedtime. (Patient not taking: Reported on 12/25/2022) 30 tablet 0   No current facility-administered medications on file prior to visit.    No Known Allergies  Social History   Socioeconomic History   Marital status: Married    Spouse name: Not on file   Number of children: Not on file   Years of education: Not on file   Highest education level: Not on file  Occupational History   Not on file  Tobacco Use   Smoking status: Never   Smokeless tobacco: Never  Substance and Sexual Activity   Alcohol use: Yes    Comment: occassionally   Drug use: No   Sexual activity: Yes  Other Topics Concern   Not on file  Social History Narrative   Married, has 2 biologic children, 4 stepchildren.   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   From [place] orig, lived in [place] x 5 yrs prior to coming to [place] 2015.   No tob, occ alc, no drugs.   Social Determinants of Health    Financial Resource Strain: Not on file  Food Insecurity: Not on file  Transportation Needs: Not on file  Physical Activity: Not on file  Stress: Not on file  Social Connections: Not on file  Intimate Partner Violence: Not on file    Family History  Problem Relation Age of Onset   Transient ischemic attack Brother 45    No past surgical history on file.  ROS: Review of Systems Negative except as stated above  PHYSICAL EXAM: BP 126/84   Pulse 66   Temp 98 F (36.7 C) (Oral)   Ht 5\' 7"  (1.702 m)   Wt 188 lb 9.6 oz (85.5 kg)   SpO2 91%   BMI 29.54 kg/m   Physical Exam HENT:     Head: Normocephalic and atraumatic.     Right Ear: Tympanic membrane, ear canal and external ear normal.     Left Ear: Tympanic membrane, ear canal and external ear normal.     Nose: Nose normal.     Mouth/Throat:     Mouth: Mucous membranes are moist.     Pharynx: Oropharynx is clear.  Eyes:     Extraocular Movements: Extraocular movements intact.     Conjunctiva/sclera: Conjunctivae normal.     Pupils: Pupils are equal, round, and reactive to light.  Cardiovascular:  Rate and Rhythm: Normal rate and regular rhythm.     Pulses: Normal pulses.     Heart sounds: Normal heart sounds.  Pulmonary:     Effort: Pulmonary effort is normal.     Breath sounds: Normal breath sounds.  Abdominal:     General: Bowel sounds are normal.     Palpations: Abdomen is soft.  Genitourinary:    Comments: Patient declined.  Musculoskeletal:        General: Normal range of motion.     Right shoulder: Normal.     Left shoulder: Normal.     Right upper arm: Normal.     Left upper arm: Normal.     Right elbow: Normal.     Left elbow: Normal.     Right forearm: Normal.     Left forearm: Normal.     Right wrist: Normal.     Left wrist: Normal.     Right hand: Normal.     Left hand: Normal.     Cervical back: Normal, normal range of motion and neck supple.     Thoracic back: Normal.     Lumbar  back: Normal.     Right hip: Normal.     Left hip: Normal.     Right upper leg: Normal.     Left upper leg: Normal.     Right knee: Normal.     Left knee: Normal.     Right lower leg: Normal.     Left lower leg: Normal.     Right ankle: Normal.     Left ankle: Normal.     Right foot: Normal.     Left foot: Normal.  Skin:    General: Skin is warm and dry.     Capillary Refill: Capillary refill takes less than 2 seconds.  Neurological:     General: No focal deficit present.     Mental Status: He is alert and oriented to person, place, and time.  Psychiatric:        Mood and Affect: Mood normal.        Behavior: Behavior normal.      ASSESSMENT AND PLAN: 1. Annual physical exam - Counseled on 150 minutes of exercise per week as tolerated, healthy eating (including decreased daily intake of saturated fats, cholesterol, added sugars, sodium), STI prevention, and routine healthcare maintenance.  2. Screening for metabolic disorder - Routine screening.  - CMP14+EGFR  3. Screening for deficiency anemia - Routine screening.  - CBC  4. Diabetes mellitus screening - Routine screening.  - Hemoglobin A1c  5. Screening cholesterol level - Routine screening.  - Lipid panel  6. Thyroid disorder screen - Routine screening.  - TSH  7. Encounter for screening for HIV - Routine screening.  - HIV antibody (with reflex)  8. Need for hepatitis C screening test - Routine screening.  - Hepatitis C Antibody  9. Testosterone deficiency - Routine screening.  - Testosterone    Patient was given the opportunity to ask questions.  Patient verbalized understanding of the plan and was able to repeat key elements of the plan. Patient was given clear instructions to go to Emergency Department or return to medical center if symptoms don't improve, worsen, or new problems develop.The patient verbalized understanding.   Orders Placed This Encounter  Procedures   HIV antibody (with  reflex)   Hepatitis C Antibody   CBC   Lipid panel   TSH   CMP14+EGFR   Hemoglobin A1c  Testosterone     Return in about 1 year (around 12/25/2023) for Physical per patient preference.  Rema Fendt, NP

## 2022-12-25 ENCOUNTER — Encounter: Payer: Self-pay | Admitting: Family

## 2022-12-25 ENCOUNTER — Ambulatory Visit (INDEPENDENT_AMBULATORY_CARE_PROVIDER_SITE_OTHER): Payer: Medicaid Other | Admitting: Family

## 2022-12-25 VITALS — BP 126/84 | HR 66 | Temp 98.0°F | Ht 67.0 in | Wt 188.6 lb

## 2022-12-25 DIAGNOSIS — Z130 Encounter for screening for diseases of the blood and blood-forming organs and certain disorders involving the immune mechanism: Secondary | ICD-10-CM

## 2022-12-25 DIAGNOSIS — E349 Endocrine disorder, unspecified: Secondary | ICD-10-CM | POA: Diagnosis not present

## 2022-12-25 DIAGNOSIS — Z13228 Encounter for screening for other metabolic disorders: Secondary | ICD-10-CM

## 2022-12-25 DIAGNOSIS — Z1159 Encounter for screening for other viral diseases: Secondary | ICD-10-CM | POA: Diagnosis not present

## 2022-12-25 DIAGNOSIS — Z0000 Encounter for general adult medical examination without abnormal findings: Secondary | ICD-10-CM | POA: Diagnosis not present

## 2022-12-25 DIAGNOSIS — Z131 Encounter for screening for diabetes mellitus: Secondary | ICD-10-CM | POA: Diagnosis not present

## 2022-12-25 DIAGNOSIS — Z13220 Encounter for screening for lipoid disorders: Secondary | ICD-10-CM

## 2022-12-25 DIAGNOSIS — Z1329 Encounter for screening for other suspected endocrine disorder: Secondary | ICD-10-CM | POA: Diagnosis not present

## 2022-12-25 DIAGNOSIS — Z114 Encounter for screening for human immunodeficiency virus [HIV]: Secondary | ICD-10-CM

## 2022-12-25 NOTE — Progress Notes (Signed)
PT wants to speak about testosterone

## 2022-12-25 NOTE — Patient Instructions (Signed)

## 2022-12-26 ENCOUNTER — Other Ambulatory Visit: Payer: Self-pay | Admitting: Family

## 2022-12-26 DIAGNOSIS — Z131 Encounter for screening for diabetes mellitus: Secondary | ICD-10-CM

## 2022-12-26 DIAGNOSIS — Z130 Encounter for screening for diseases of the blood and blood-forming organs and certain disorders involving the immune mechanism: Secondary | ICD-10-CM

## 2022-12-26 LAB — CMP14+EGFR
ALT: 22 IU/L (ref 0–44)
AST: 23 IU/L (ref 0–40)
Albumin: 4.7 g/dL (ref 4.1–5.1)
Alkaline Phosphatase: 59 IU/L (ref 44–121)
BUN/Creatinine Ratio: 12 (ref 9–20)
BUN: 14 mg/dL (ref 6–24)
Bilirubin Total: 0.9 mg/dL (ref 0.0–1.2)
CO2: 22 mmol/L (ref 20–29)
Calcium: 9.5 mg/dL (ref 8.7–10.2)
Chloride: 100 mmol/L (ref 96–106)
Creatinine, Ser: 1.15 mg/dL (ref 0.76–1.27)
Globulin, Total: 2.4 g/dL (ref 1.5–4.5)
Glucose: 92 mg/dL (ref 70–99)
Potassium: 4.4 mmol/L (ref 3.5–5.2)
Sodium: 134 mmol/L (ref 134–144)
Total Protein: 7.1 g/dL (ref 6.0–8.5)
eGFR: 81 mL/min/{1.73_m2} (ref >59)

## 2022-12-26 LAB — TESTOSTERONE: Testosterone: 306 ng/dL (ref 264–916)

## 2022-12-26 LAB — HEPATITIS C ANTIBODY: Hep C Virus Ab: NONREACTIVE

## 2022-12-26 LAB — CBC

## 2022-12-26 LAB — LIPID PANEL
Chol/HDL Ratio: 3.1 ratio (ref 0.0–5.0)
Cholesterol, Total: 206 mg/dL — ABNORMAL HIGH (ref 100–199)
HDL: 66 mg/dL (ref >39)
LDL Chol Calc (NIH): 109 mg/dL — ABNORMAL HIGH (ref 0–99)
Triglycerides: 178 mg/dL — ABNORMAL HIGH (ref 0–149)
VLDL Cholesterol Cal: 31 mg/dL (ref 5–40)

## 2022-12-26 LAB — HEMOGLOBIN A1C

## 2022-12-26 LAB — HIV ANTIBODY (ROUTINE TESTING W REFLEX): HIV Screen 4th Generation wRfx: NONREACTIVE

## 2022-12-26 LAB — TSH: TSH: 2.61 u[IU]/mL (ref 0.450–4.500)

## 2023-01-01 NOTE — Progress Notes (Unsigned)
NEW PATIENT Date of Service/Encounter:  01/03/23 Referring provider: Rema Fendt, NP Primary care provider: Rema Fendt, NP  Subjective:  Paul Mosley is a 42 y.o. male with a PMHx of hypogonadotrophic hypogonadism presenting today for evaluation of chronic rhinitis. History obtained from: chart review and patient.   Chronic rhinitis:  He is from [place], came here around 9 years ago.   Started in [place]. Symptoms include: nasal congestion, rhinorrhea, post nasal drainage, sneezing, watery eyes, itchy eyes, and itchy nose  Occurs seasonally-spring Potential triggers: tree pollen Treatments tried: PRN benadryl, zyrtec Previous allergy testing: no History of reflux/heartburn:  infrequent Previous sinus, ear, tonsil, adenoid surgeries: no  Occasionally will break out into hives on his back.  Happens once every couple of months.  Lasts around one week. He will take benadryl which helps some.  No bruising. Significant itching, and the more he scratches, the bigger hives become. He sometimes wonders if pork does it, but no specific triggers. He eats pork regularly and does not always have symptoms. Or he also worries about food additves.   Chart review: PCP notes from referral 11/10/2022: "Allergies caused primarily by environmental allergens. Endorses itchy eyes, runny nose, throat itching, and sneezing. He tried over-the-counter Mucinex, eye drops, and lemon ginger tea with no relief. Also, concern for intermittent hives of back/side. Thinks may be related to pork. He declines testing today of the same. He would like referral to specialist for further evaluation. "  Other allergy screening: Asthma: no Food allergy: no Medication allergy: no Hymenoptera allergy: no Urticaria: no Eczema:no History of recurrent infections suggestive of immunodeficency: no Vaccinations are up to date.   Past Medical History: Past Medical History:  Diagnosis Date   Hypogonadism, male 11/2015    hypogonadotropic hypogonadism: normalized after getting off many supplements (released from Dr. Charlean Sanfilippo 06/05/16)   Medication List:  Current Outpatient Medications  Medication Sig Dispense Refill   aspirin EC 325 MG tablet Take 325 mg by mouth as needed. (Patient not taking: Reported on 12/25/2022)     cetirizine (ZYRTEC) 10 MG tablet Take 1 tablet (10 mg total) by mouth daily. (Patient not taking: Reported on 12/25/2022) 30 tablet 2   cyclobenzaprine (FLEXERIL) 10 MG tablet TAKE 1 TABLET BY MOUTH TWICE A DAY AS NEEDED FOR MUSCLE SPASMS (Patient not taking: Reported on 01/03/2023)     ibuprofen (ADVIL) 800 MG tablet Take 1 tablet (800 mg total) by mouth every 8 (eight) hours as needed. (Patient not taking: Reported on 11/10/2022) 21 tablet 0   traZODone (DESYREL) 50 MG tablet Take 1 tablet (50 mg total) by mouth at bedtime. (Patient not taking: Reported on 12/25/2022) 30 tablet 0   No current facility-administered medications for this visit.   Known Allergies:  No Known Allergies Past Surgical History: History reviewed. No pertinent surgical history. 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   ROS:  All other systems negative except as noted per HPI.  Objective:  Blood pressure (!) 110/90, pulse 71, temperature 98.9 F (37.2 C), height 5' 7.72" (1.72 m), weight 188 lb (85.3 kg), SpO2 97 %. Body mass index is 28.82 kg/m. Physical Exam:  General Appearance:  Alert, cooperative, no distress, appears stated age  Head:  Normocephalic, without obvious abnormality, atraumatic  Eyes:  Conjunctiva clear, EOM's intact  Nose: Nares normal,  hypertrophic turbinates, normal mucosa, and no visible anterior polyps  Throat: Lips, tongue normal; teeth and gums normal, normal posterior oropharynx  Neck: Supple, symmetrical  Lungs:   clear to auscultation bilaterally and wheezing throughout, Respirations unlabored, no coughing  Heart:  regular rate and rhythm and no murmur, Appears well perfused  Extremities: No edema  Skin: Skin color, texture, turgor normal and no rashes or lesions on visualized portions of skin  Neurologic: No gross deficits   Diagnostics: Skin Testing: Environmental allergy panel and select foods. Adequate positive and negative controls. Results discussed with patient/family.  Airborne Adult Perc - 01/03/23 0800     Allergen Manufacturer Waynette Buttery    Location Back    Number of Test 55    1. Control-Buffer 50% Glycerol Negative    2. Control-Histamine 3+    3. Bahia 3+    4. [place] Negative    5. Johnson 3+    6. [place] Blue 4+    7. Meadow Fescue 4+    8. Perennial Rye 4+    9. Timothy 4+    10. Ragweed Mix 3+    11. Cocklebur 3+    12. Plantain,  English 3+    13. Baccharis Negative    14. Dog Fennel 3+    15. Russian Thistle 3+    16. Lamb's Quarters 3+    17. Sheep Sorrell Negative    18. Rough Pigweed Negative    19. Marsh Elder, Rough Negative    20. Mugwort, Common 3+    21. Box, Elder 3+    22. Cedar, red Negative    23. Sweet Gum 3+    24. Pecan Pollen 3+    25. Pine Mix Negative    26. Walnut, Black Pollen Negative    27. Red Mulberry Negative    28. Ash Mix 3+    29. Birch Mix 3+    30. Beech American 3+    31. Cottonwood, Eastern 3+    32. Hickory, White 3+    33. Maple Mix 3+    34. Oak, [place] Mix 4+    35. Sycamore Eastern 3+    36. Alternaria Alternata Negative    37. Cladosporium Herbarum Negative    38. Aspergillus Mix Negative    39. Penicillium Mix 3+    40. Bipolaris Sorokiniana (Helminthosporium) 3+    41. Drechslera Spicifera (Curvularia) 3+    42. Mucor  Plumbeus 3+    43. Fusarium Moniliforme 2+    44. Aureobasidium Pullulans (pullulara) Negative    45. Rhizopus Oryzae Negative    46. Botrytis Cinera Negative    47. Epicoccum Nigrum Negative    48. Phoma Betae Negative    49. Dust Mite Mix Negative    50. Cat Hair 10,000 BAU/ml Negative    51.  Dog Epithelia 3+    52. Mixed Feathers 3+    53. Horse Epithelia 3+    54. Cockroach, German 3+    55. Tobacco Leaf Negative             13 Food Perc - 01/03/23 0800       Test Information   Allergen Manufacturer Greer    Location Back    Number of allergen test 13      Food   1. Peanut  Negative    2. Soybean Negative    3. Wheat Negative    4. Sesame Negative    5. Milk, Cow Negative    6. Casein Negative    7. Egg White, Chicken Negative    8. Shellfish Mix Negative    9. Fish Mix Negative    10. Cashew Negative    11. Walnut Food Negative    12. Almond Negative    13. Hazelnut Negative             Intradermal - 01/03/23 1037     Time Antigen Placed 1017    Allergen Manufacturer Waynette Buttery    Location Back    Number of Test 5    Control Negative    [place] Omitted    [place] 4+    Johnson Omitted    7 Grass Omitted    Ragweed Mix Omitted    Weed Mix Omitted    Tree Mix Omitted    Mold 1 Negative    Mold 2 Omitted    Mold 3 Omitted    Mold 4 Omitted    Mite Mix 2+    Cat Negative    Dog Omitted    Cockroach Omitted    Other Omitted             Food Adult Perc - 01/03/23 0800     Allergen Manufacturer Waynette Buttery    Location Back    Number of allergen test 1    35. Pork Negative             Allergy testing results were read and interpreted by myself, documented by clinical staff.  Assessment and Plan  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 Seasonal and Perennial Allergic: - allergy testing today: positive to grass pollen, weed pollen, tree pollen, dust mites, indoor/outdoor molds, dog, mixed feathers, horse, cockroach - Prevention:  - allergen  avoidance when possible - consider allergy shots as long term control of your symptoms by teaching your immune system to be more tolerant of your allergy triggers - Symptom control: - Start Dymista 1 spray twice daily - Start Xyzal (Levocetirinze) 5mg  daily  Allergic Conjunctivitis:  - Start Allergy Eye drops-pataday 1 drop each eye daily as needed  -Avoid eye drops that say red eye relief as they may contain medications that dry out your eyes.  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 - approximately 50% of patients with chronic hives can have some associated swelling of the face/lips/eyelids (this is not a cause for alarm and does not typically progress onto systemic allergic reactions)  Therapy Plan:  - start xyzal (levocetirizine)  5 mg once daily - if hives are uncontrolled, increase xyzal (levocetirizine)  5 mg twice daily - if hives remain uncontrolled, increase dose of xyzal (levocetirizine)  10 mg (2 pills) twice daily- this is maximum dose - can increase or decrease dosing depending on symptom control to a maximum dose of 4 tablets of antihistamine daily. Wait until hives free for at least one month prior to decreasing dose.   - if hives are still uncontrolled with the above regimen, please arrange  an appointment for discussion of Xolair (omalizumab)- an injectable medication for hives  Can use one of the following in place of zyrtec if desires: Claritin (loratadine) 10 mg, Xyzal (levocetirizine) 5 mg or Allegra (fexofenadine) 180 mg daily as  needed  Follow up : 3 months, sooner if needed It was a pleasure meeting you in clinic today! Thank you for allowing me to participate in your care.  This note in its entirety was forwarded to the Provider who requested this consultation.  Thank you for your kind referral. I appreciate the opportunity to take part in Munachimso's care. Please do not hesitate to contact me with questions.  Sincerely,  Tonny Bollman, MD [facility]

## 2023-01-03 ENCOUNTER — Encounter: Payer: Self-pay | Admitting: Internal Medicine

## 2023-01-03 ENCOUNTER — Other Ambulatory Visit: Payer: Self-pay

## 2023-01-03 ENCOUNTER — Ambulatory Visit (INDEPENDENT_AMBULATORY_CARE_PROVIDER_SITE_OTHER): Payer: Medicaid Other | Admitting: Internal Medicine

## 2023-01-03 VITALS — BP 110/90 | HR 71 | Temp 98.9°F | Ht 67.72 in | Wt 188.0 lb

## 2023-01-03 DIAGNOSIS — L508 Other urticaria: Secondary | ICD-10-CM | POA: Diagnosis not present

## 2023-01-03 DIAGNOSIS — T781XXA Other adverse food reactions, not elsewhere classified, initial encounter: Secondary | ICD-10-CM

## 2023-01-03 DIAGNOSIS — J3089 Other allergic rhinitis: Secondary | ICD-10-CM | POA: Diagnosis not present

## 2023-01-03 DIAGNOSIS — H1013 Acute atopic conjunctivitis, bilateral: Secondary | ICD-10-CM | POA: Diagnosis not present

## 2023-01-03 DIAGNOSIS — J302 Other seasonal allergic rhinitis: Secondary | ICD-10-CM | POA: Insufficient documentation

## 2023-01-03 MED ORDER — LEVOCETIRIZINE DIHYDROCHLORIDE 5 MG PO TABS
5.0000 mg | ORAL_TABLET | Freq: Every evening | ORAL | 5 refills | Status: DC
Start: 2023-01-03 — End: 2023-04-02

## 2023-01-03 MED ORDER — OLOPATADINE HCL 0.2 % OP SOLN: 1.0000 [drp] | Freq: Every day | OPHTHALMIC | 5 refills | Status: DC | PRN

## 2023-01-03 MED ORDER — AZELASTINE-FLUTICASONE 137-50 MCG/ACT NA SUSP: 1.0000 | Freq: Two times a day (BID) | NASAL | 5 refills | Status: DC | PRN

## 2023-01-03 NOTE — Patient Instructions (Addendum)
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 Seasonal and Perennial Allergic: - allergy testing today: positive to grass pollen, weed pollen, tree pollen, dust mites, indoor/outdoor molds, dog, mixed feathers, horse, cockroach - Prevention:  - allergen avoidance when possible - consider allergy shots as long term control of your symptoms by teaching your immune system to be more tolerant of your allergy triggers - Symptom control: - Start Dymista 1 spray twice daily - Start Xyzal (Levocetirinze) 5mg  daily  Allergic Conjunctivitis:  - Start Allergy Eye drops-pataday 1 drop each eye daily as needed  -Avoid eye drops that say red eye relief as they may contain medications that dry out your eyes.  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 - approximately 50% of patients with chronic hives can have some associated swelling of the face/lips/eyelids (this is not a cause for alarm and does not typically progress onto systemic allergic reactions)  Therapy Plan:  - start xyzal (levocetirizine)  5 mg once daily - if hives are uncontrolled, increase xyzal (levocetirizine)  5 mg twice daily - if hives remain uncontrolled, increase dose of xyzal (levocetirizine)  10 mg (2 pills) twice daily- this is maximum dose - can increase or decrease dosing depending on symptom control to a maximum dose of 4 tablets of antihistamine daily. Wait until hives free for at least one month prior to decreasing dose.   - if hives are still uncontrolled with the above regimen, please arrange an appointment for discussion of  Xolair (omalizumab)- an injectable medication for hives  Can use one of the following in place of zyrtec if desires: Claritin (loratadine) 10 mg, Xyzal (levocetirizine) 5 mg or Allegra (fexofenadine) 180 mg daily as needed  Follow up : 3 months, sooner if needed It was a pleasure meeting you in clinic today! Thank you for allowing me to participate in your care.  Tonny Bollman, MD [facility]    Reducing Pollen Exposure  The American Academy of Allergy, Asthma and Immunology suggests the following steps to reduce your exposure to pollen during allergy seasons.    Do not hang sheets or clothing out to dry; pollen may collect on these items. Do not mow lawns or spend time around freshly cut grass; mowing stirs up pollen. Keep windows closed at night.  Keep car windows closed while driving. Minimize morning activities outdoors, a time when pollen counts are usually at their highest. Stay indoors as much as possible when pollen counts or humidity is high and on windy days when pollen tends to remain in the air longer. Use air conditioning when possible.  Many air conditioners have filters that trap the pollen spores. Use a HEPA room air filter to remove pollen form the indoor air you breathe. DUST MITE AVOIDANCE MEASURES:  There are three main measures that need and can be taken to avoid house dust mites:  Reduce accumulation of dust in general -reduce furniture, clothing, carpeting, books, stuffed animals, especially in bedroom  Separate yourself from the dust -use pillow and mattress encasements (can be found at stores such as [employer], [employer] or online) -avoid direct exposure to air condition  flow -use a HEPA filter device, especially in the bedroom; you can also use a HEPA filter vacuum cleaner -wipe dust with a moist towel instead of a dry towel or broom when cleaning  Decrease mites and/or their secretions -wash clothing and linen and stuffed animals at  highest temperature possible, at least every 2 weeks -stuffed animals can also be placed in a bag and put in a freezer overnight  Despite the above measures, it is impossible to eliminate dust mites or their allergen completely from your home.  With the above measures the burden of mites in your home can be diminished, with the goal of minimizing your allergic symptoms.  Success will be reached only when implementing and using all means together. Control of Dog or Cat Allergen  Avoidance is the best way to manage a dog or cat allergy. If you have a dog or cat and are allergic to dog or cats, consider removing the dog or cat from the home. If you have a dog or cat but don't want to find it a new home, or if your family wants a pet even though someone in the household is allergic, here are some strategies that may help keep symptoms at bay:  Keep the pet out of your bedroom and restrict it to only a few rooms. Be advised that keeping the dog or cat in only one room will not limit the allergens to that room. Don't pet, hug or kiss the dog or cat; if you do, wash your hands with soap and water. High-efficiency particulate air (HEPA) cleaners run continuously in a bedroom or living room can reduce allergen levels over time. Regular use of a high-efficiency vacuum cleaner or a central vacuum can reduce allergen levels. Giving your dog or cat a bath at least once a week can reduce airborne allergen. Control of Mold Allergen   Mold and fungi can grow on a variety of surfaces provided certain temperature and moisture conditions exist.  Outdoor molds grow on plants, decaying vegetation and soil.  The major outdoor mold, Alternaria and Cladosporium, are found in very high numbers during hot and dry conditions.  Generally, a late Summer - Fall peak is seen for common outdoor fungal spores.  Rain will temporarily lower outdoor mold spore count, but counts rise rapidly when the rainy period ends.  The most  important indoor molds are Aspergillus and Penicillium.  Dark, humid and poorly ventilated basements are ideal sites for mold growth.  The next most common sites of mold growth are the bathroom and the kitchen.  Outdoor (Seasonal) Mold Control  Use air conditioning and keep windows closed Avoid exposure to decaying vegetation. Avoid leaf raking. Avoid grain handling. Consider wearing a face mask if working in moldy areas.    Indoor (Perennial) Mold Control   Maintain humidity below 50%. Clean washable surfaces with 5% bleach solution. Remove sources e.g. contaminated carpets.   Control of Cockroach Allergen  Cockroach allergen has been identified as an important cause of acute attacks of asthma, especially in urban settings.  There are fifty-five species of cockroach that exist in the [place], however only three, the [place], [place] species produce allergen that can affect patients with Asthma.  Allergens can be obtained from fecal particles, egg casings and secretions from cockroaches.    Remove food sources. Reduce access to water. Seal access and entry points. Spray runways with 0.5-1% Diazinon or Chlorpyrifos Blow boric acid power under stoves and refrigerator. Place bait  stations (hydramethylnon) at feeding sites.

## 2023-04-02 ENCOUNTER — Encounter: Payer: Self-pay | Admitting: Internal Medicine

## 2023-04-02 ENCOUNTER — Other Ambulatory Visit: Payer: Self-pay

## 2023-04-02 ENCOUNTER — Ambulatory Visit (INDEPENDENT_AMBULATORY_CARE_PROVIDER_SITE_OTHER): Payer: Medicaid Other | Admitting: Internal Medicine

## 2023-04-02 VITALS — BP 114/88 | HR 58 | Temp 98.2°F | Wt 187.0 lb

## 2023-04-02 DIAGNOSIS — J302 Other seasonal allergic rhinitis: Secondary | ICD-10-CM | POA: Diagnosis not present

## 2023-04-02 DIAGNOSIS — L508 Other urticaria: Secondary | ICD-10-CM

## 2023-04-02 DIAGNOSIS — H1013 Acute atopic conjunctivitis, bilateral: Secondary | ICD-10-CM | POA: Diagnosis not present

## 2023-04-02 DIAGNOSIS — J3089 Other allergic rhinitis: Secondary | ICD-10-CM | POA: Diagnosis not present

## 2023-04-02 MED ORDER — AZELASTINE HCL 0.1 % NA SOLN: 2.0000 | Freq: Two times a day (BID) | NASAL | 5 refills | Status: DC

## 2023-04-02 MED ORDER — FLUTICASONE PROPIONATE 50 MCG/ACT NA SUSP: 2.0000 | Freq: Every day | NASAL | 5 refills | Status: DC

## 2023-04-02 MED ORDER — AZELASTINE-FLUTICASONE 137-50 MCG/ACT NA SUSP: 1.0000 | Freq: Two times a day (BID) | NASAL | 5 refills | Status: DC | PRN

## 2023-04-02 MED ORDER — EPINEPHRINE 0.3 MG/0.3ML IJ SOAJ: 0.3000 mg | INTRAMUSCULAR | 2 refills | Status: DC | PRN

## 2023-04-02 MED ORDER — OLOPATADINE HCL 0.2 % OP SOLN: 1.0000 [drp] | Freq: Every day | OPHTHALMIC | 5 refills | Status: DC | PRN

## 2023-04-02 MED ORDER — LEVOCETIRIZINE DIHYDROCHLORIDE 5 MG PO TABS: 5.0000 mg | ORAL_TABLET | Freq: Every evening | ORAL | 5 refills | Status: DC

## 2023-04-02 NOTE — Patient Instructions (Addendum)
Seasonal and Perennial Allergic: - allergy testing 01/03/23 positive to grass pollen, weed pollen, tree pollen, dust mites, indoor/outdoor molds, dog, mixed feathers, horse, cockroach - Prevention:  - allergen avoidance when possible - consider allergy shots as long term control of your symptoms by teaching your immune system to be more tolerant of your allergy triggers - paperwork and consent given today, will call back to schedule - Symptom control: - Continue Dymista 1 spray twice daily as needed - Continue Xyzal (Levocetirinze) 5mg  daily as needed  Allergic Conjunctivitis:  - Continue Allergy Eye drops-pataday 1 drop each eye daily as needed  -Avoid eye drops that say red eye relief as they may contain medications that dry out your eyes.  Chronic Idiopathic Urticaria: Therapy Plan:  - continue xyzal (levocetirizine)  5 mg once daily - if hives are uncontrolled, increase xyzal (levocetirizine)  5 mg twice daily - if hives remain uncontrolled, increase dose of xyzal (levocetirizine)  10 mg (2 pills) twice daily- this is maximum dose - can increase or decrease dosing depending on symptom control to a maximum dose of 4 tablets of antihistamine daily. Wait until hives free for at least one month prior to decreasing dose.   - if hives are still uncontrolled with the above regimen, please arrange an appointment for discussion of Xolair (omalizumab)- an injectable medication for hives  Can use one of the following in place of zyrtec if desires: Claritin (loratadine) 10 mg, Xyzal (levocetirizine) 5 mg or Allegra (fexofenadine) 180 mg daily as needed  Follow up : 6 months, sooner if needed Sooner for allergy injections. It was a pleasure seeing you again in clinic today! Thank you for allowing me to participate in your care.  Tonny Bollman, MD [facility]    Reducing Pollen Exposure  The American Academy of Allergy, Asthma and Immunology suggests the following steps to  reduce your exposure to pollen during allergy seasons.    Do not hang sheets or clothing out to dry; pollen may collect on these items. Do not mow lawns or spend time around freshly cut grass; mowing stirs up pollen. Keep windows closed at night.  Keep car windows closed while driving. Minimize morning activities outdoors, a time when pollen counts are usually at their highest. Stay indoors as much as possible when pollen counts or humidity is high and on windy days when pollen tends to remain in the air longer. Use air conditioning when possible.  Many air conditioners have filters that trap the pollen spores. Use a HEPA room air filter to remove pollen form the indoor air you breathe. DUST MITE AVOIDANCE MEASURES:  There are three main measures that need and can be taken to avoid house dust mites:  Reduce accumulation of dust in general -reduce furniture, clothing, carpeting, books, stuffed animals, especially in bedroom  Separate yourself from the dust -use pillow and mattress encasements (can be found at stores such as Bed, [employer], [employer] or online) -avoid direct exposure to air condition flow -use a HEPA filter device, especially in the bedroom; you can also use a HEPA filter vacuum cleaner -wipe dust with a moist towel instead of a dry towel or broom when cleaning  Decrease mites and/or their secretions -wash clothing and linen and stuffed animals at highest temperature possible, at least every 2 weeks -stuffed animals can also be placed in a bag and put in a freezer overnight  Despite the above measures, it is impossible to eliminate dust mites  or their allergen completely from your home.  With the above measures the burden of mites in your home can be diminished, with the goal of minimizing your allergic symptoms.  Success will be reached only when implementing and using all means together. Control of Dog or Cat Allergen  Avoidance is the best way to manage a dog or cat  allergy. If you have a dog or cat and are allergic to dog or cats, consider removing the dog or cat from the home. If you have a dog or cat but don't want to find it a new home, or if your family wants a pet even though someone in the household is allergic, here are some strategies that may help keep symptoms at bay:  Keep the pet out of your bedroom and restrict it to only a few rooms. Be advised that keeping the dog or cat in only one room will not limit the allergens to that room. Don't pet, hug or kiss the dog or cat; if you do, wash your hands with soap and water. High-efficiency particulate air (HEPA) cleaners run continuously in a bedroom or living room can reduce allergen levels over time. Regular use of a high-efficiency vacuum cleaner or a central vacuum can reduce allergen levels. Giving your dog or cat a bath at least once a week can reduce airborne allergen. Control of Mold Allergen   Mold and fungi can grow on a variety of surfaces provided certain temperature and moisture conditions exist.  Outdoor molds grow on plants, decaying vegetation and soil.  The major outdoor mold, Alternaria and Cladosporium, are found in very high numbers during hot and dry conditions.  Generally, a late Summer - Fall peak is seen for common outdoor fungal spores.  Rain will temporarily lower outdoor mold spore count, but counts rise rapidly when the rainy period ends.  The most important indoor molds are Aspergillus and Penicillium.  Dark, humid and poorly ventilated basements are ideal sites for mold growth.  The next most common sites of mold growth are the bathroom and the kitchen.  Outdoor (Seasonal) Mold Control  Use air conditioning and keep windows closed Avoid exposure to decaying vegetation. Avoid leaf raking. Avoid grain handling. Consider wearing a face mask if working in moldy areas.    Indoor (Perennial) Mold Control   Maintain humidity below 50%. Clean washable surfaces with 5% bleach  solution. Remove sources e.g. contaminated carpets.   Control of Cockroach Allergen  Cockroach allergen has been identified as an important cause of acute attacks of asthma, especially in urban settings.  There are fifty-five species of cockroach that exist in the [place], however only three, the [place], [place] species produce allergen that can affect patients with Asthma.  Allergens can be obtained from fecal particles, egg casings and secretions from cockroaches.    Remove food sources. Reduce access to water. Seal access and entry points. Spray runways with 0.5-1% Diazinon or Chlorpyrifos Blow boric acid power under stoves and refrigerator. Place bait stations (hydramethylnon) at feeding sites.

## 2023-04-02 NOTE — Progress Notes (Signed)
FOLLOW UP Date of Service/Encounter:  04/02/23  Subjective:  Paul Mosley (DOB: [birth year]) is a 42 y.o. 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 History obtained from: chart review and patient.  For Review, LV was on 01/02/41  with Dr.Tivis Wherry seen for  initial visit for allergic rhinitis and conjunctivitis and hives . See below for summary of history and diagnostics.  ----------------------------------------------------- Pertinent History/Diagnostics:  Allergic Rhinitis:  He is from [place], came here around 9 years ago. Symptoms started in [place]. Symptoms include: nasal congestion, rhinorrhea, post nasal drainage, sneezing, watery eyes, itchy eyes, and itchy nose. Occurs seasonally-spring and fall. No ENT surgeries. Infrequent heart burn. - SPT environmental panel (12/24/22): positive to grass pollen, weed pollen, tree pollen, dust mites, indoor/outdoor molds, dog, mixed feathers, horse, cockroach  -Current Meds: Dymista nasal spray and Xyzal, Pataday as needed Urticaria:  Occasionally will break out into hives on his back.  Happens once every couple of months.  Lasts around one week. He will take benadryl which helps some.  No bruising. Significant itching, and the more he scratches, the bigger hives become. He sometimes wonders if pork does it, but no specific triggers. He eats pork regularly and does not always have symptoms. -Based on history reassurance provided that pork cannot trigger for symptoms. Current meds: Xyzal-discussed using high doses for hives including up to 4 tablets daily. --------------------------------------------------- Discussed the use of AI scribe software for clinical note transcription with the patient, who gave verbal consent to proceed.  History of Present Illness   The patient, with a history of seasonal allergies,  presents for a follow-up visit. He reports intermittent use of Xyzal and Dymista nasal spray, taking them as needed. He has not had any hives recently. However, he anticipates worsening symptoms with the upcoming pollen season, affecting his nose, eyes, and ears. He is considering wearing a mask during this time. The patient is interested in starting allergy injections to reduce his reliance on medications. He is aware of the two options for the build-up process and is leaning towards the traditional method due to the time commitment of the rapid build-up process. He understands that he may need to pay a copay for each injection.        All medications reviewed by clinical staff and updated in chart. No new pertinent medical or surgical history except as noted in HPI.  ROS: All others negative except as noted per HPI.   Objective:  BP 114/88   Pulse (!) 58   Temp 98.2 F (36.8 C) (Temporal)   Wt 187 lb (84.8 kg)   SpO2 98%   BMI 28.67 kg/m  Body mass index is 28.67 kg/m. Physical Exam: General Appearance:  Alert, cooperative, no distress, appears stated age  Head:  Normocephalic, without obvious abnormality, atraumatic  Eyes:  Conjunctiva clear, EOM's intact  Ears EACs normal bilaterally and normal TMs bilaterally  Nose: Nares normal, hypertrophic turbinates, normal mucosa, and no visible anterior polyps  Throat: Lips, tongue normal; teeth and gums normal, normal posterior oropharynx  Neck: Supple, symmetrical  Lungs:   clear to auscultation bilaterally, Respirations unlabored, no coughing  Heart:  regular rate and rhythm and no murmur, Appears well perfused  Extremities: No edema  Skin: Skin color, texture, turgor normal and no rashes or lesions on visualized portions of skin  Neurologic: No gross deficits   Labs:  Lab Orders  No laboratory test(s) ordered today     Assessment/Plan   Seasonal and Perennial Allergic: not at goal Symptoms controlled with intermittent use of  Xyzal and Dymista nasal spray. No recent hives. Anticipates increased symptoms with upcoming pollen season. - allergy testing 01/03/23 positive to grass pollen, weed pollen, tree pollen, dust mites, indoor/outdoor molds, dog, mixed feathers, horse, cockroach - Prevention:  - allergen avoidance when possible - consider allergy shots as long term control of your symptoms by teaching your immune system to be more tolerant of your allergy triggers - paperwork and consent given today, will call back to schedule - Symptom control: - Continue Dymista 1 spray twice daily as needed - Continue Xyzal (Levocetirinze) 5mg  daily as needed  Allergic Conjunctivitis: not at goal - Continue Allergy Eye drops-pataday 1 drop each eye daily as needed  -Avoid eye drops that say red eye relief as they may contain medications that dry out your eyes.  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 Wait until hives free for at least one month prior to decreasing dose.   - if hives are still uncontrolled with the above regimen, please arrange an appointment for discussion of Xolair (omalizumab)- an injectable medication for hives  Can use one of the following in place of zyrtec if desires: Claritin (loratadine) 10 mg, Xyzal (levocetirizine) 5 mg or Allegra (fexofenadine) 180 mg daily as needed  Follow up : 6 months, sooner if needed Sooner for allergy injections. It was a pleasure seeing you again in clinic today! Thank you for allowing me to participate in your care.  Other: consents for allergy injections given  Tonny Bollman, MD  [facility]

## 2023-04-03 ENCOUNTER — Other Ambulatory Visit (HOSPITAL_COMMUNITY): Payer: Self-pay

## 2023-10-01 ENCOUNTER — Other Ambulatory Visit: Payer: Self-pay

## 2023-10-01 ENCOUNTER — Telehealth: Payer: Self-pay

## 2023-10-01 ENCOUNTER — Encounter: Payer: Self-pay | Admitting: Internal Medicine

## 2023-10-01 ENCOUNTER — Other Ambulatory Visit (HOSPITAL_COMMUNITY): Payer: Self-pay

## 2023-10-01 ENCOUNTER — Ambulatory Visit (INDEPENDENT_AMBULATORY_CARE_PROVIDER_SITE_OTHER): Payer: Medicaid Other | Admitting: Internal Medicine

## 2023-10-01 VITALS — BP 110/80 | HR 82 | Temp 98.7°F | Ht 67.0 in | Wt 186.4 lb

## 2023-10-01 DIAGNOSIS — J302 Other seasonal allergic rhinitis: Secondary | ICD-10-CM | POA: Diagnosis not present

## 2023-10-01 DIAGNOSIS — J3089 Other allergic rhinitis: Secondary | ICD-10-CM | POA: Diagnosis not present

## 2023-10-01 DIAGNOSIS — H1013 Acute atopic conjunctivitis, bilateral: Secondary | ICD-10-CM | POA: Diagnosis not present

## 2023-10-01 DIAGNOSIS — L508 Other urticaria: Secondary | ICD-10-CM | POA: Diagnosis not present

## 2023-10-01 MED ORDER — AZELASTINE-FLUTICASONE 137-50 MCG/ACT NA SUSP: 1.0000 | Freq: Two times a day (BID) | NASAL | 5 refills | Status: DC | PRN

## 2023-10-01 MED ORDER — LEVOCETIRIZINE DIHYDROCHLORIDE 5 MG PO TABS: 5.0000 mg | ORAL_TABLET | Freq: Every evening | ORAL | 5 refills | Status: DC

## 2023-10-01 MED ORDER — OLOPATADINE HCL 0.2 % OP SOLN: 1.0000 [drp] | Freq: Every day | OPHTHALMIC | 5 refills | Status: DC | PRN

## 2023-10-01 NOTE — Telephone Encounter (Signed)
-----   Message from Verlee Monte sent at 10/01/2023  1:32 PM EDT ----- Patient would like to schedule R[facility] in [place]. Signed consent today. Thanks

## 2023-10-01 NOTE — Telephone Encounter (Signed)
*  Asthma/Allergy  Pharmacy Patient Advocate Encounter   Received notification from CoverMyMeds that prior authorization for Azelastine-Fluticasone 137-50MCG/ACT suspension  is required/requested.   Insurance verification completed.   The patient is insured through [facility] .   Per test claim:  Paul Mosley is preferred by the insurance.  If suggested medication is appropriate, Please send in a new RX and discontinue this one. If not, please advise as to why it's not appropriate so that we may request a Prior Authorization. Please note, some preferred medications may still require a PA.  If the suggested medications have not been trialed and there are no contraindications to their use, the PA will not be submitted, as it will not be approved.   Brand Dymista is refill too soon- last fill 03/24

## 2023-10-01 NOTE — Patient Instructions (Addendum)
 Seasonal and Perennial Allergic: - allergy testing 01/03/23 positive to grass pollen, weed pollen, tree pollen, dust mites, indoor/outdoor molds, dog, mixed feathers, horse, cockroach - Prevention:  - allergen avoidance when possible - consider allergy shots as long term control of your symptoms by teaching your immune system to be more tolerant of your allergy triggers - paperwork and consent given today, we will call you to schedule - Symptom control: - Continue Dymista 1 spray twice daily as needed - Continue Xyzal (Levocetirinze) 5mg  daily as needed  Allergic Conjunctivitis:  - Continue Allergy Eye drops-pataday 1 drop each eye daily as needed  -Avoid eye drops that say red eye relief as they may contain medications that dry out your eyes.  Chronic Idiopathic Urticaria: Therapy Plan:  - continue xyzal (levocetirizine)  5 mg once daily - if hives are uncontrolled, increase xyzal (levocetirizine)  5 mg twice daily - if hives remain uncontrolled, increase dose of xyzal (levocetirizine)  10 mg (2 pills) twice daily- this is maximum dose - can increase or decrease dosing depending on symptom control to a maximum dose of 4 tablets of antihistamine daily. Wait until hives free for at least one month prior to decreasing dose.   - if hives are still uncontrolled with the above regimen, please arrange an appointment for discussion of Xolair (omalizumab)- an injectable medication for hives  Can use one of the following in place of zyrtec if desires: Claritin (loratadine) 10 mg, Xyzal (levocetirizine) 5 mg or Allegra (fexofenadine) 180 mg daily as needed  Follow up : 6 months, sooner if needed Sooner for allergy injections. It was a pleasure seeing you again in clinic today! Thank you for allowing me to participate in your care.  Tonny Bollman, MD [facility]    Reducing Pollen Exposure  The American Academy of Allergy, Asthma and Immunology suggests the following steps  to reduce your exposure to pollen during allergy seasons.    Do not hang sheets or clothing out to dry; pollen may collect on these items. Do not mow lawns or spend time around freshly cut grass; mowing stirs up pollen. Keep windows closed at night.  Keep car windows closed while driving. Minimize morning activities outdoors, a time when pollen counts are usually at their highest. Stay indoors as much as possible when pollen counts or humidity is high and on windy days when pollen tends to remain in the air longer. Use air conditioning when possible.  Many air conditioners have filters that trap the pollen spores. Use a HEPA room air filter to remove pollen form the indoor air you breathe. DUST MITE AVOIDANCE MEASURES:  There are three main measures that need and can be taken to avoid house dust mites:  Reduce accumulation of dust in general -reduce furniture, clothing, carpeting, books, stuffed animals, especially in bedroom  Separate yourself from the dust -use pillow and mattress encasements (can be found at stores such as [employer] or online) -avoid direct exposure to air condition flow -use a HEPA filter device, especially in the bedroom; you can also use a HEPA filter vacuum cleaner -wipe dust with a moist towel instead of a dry towel or broom when cleaning  Decrease mites and/or their secretions -wash clothing and linen and stuffed animals at highest temperature possible, at least every 2 weeks -stuffed animals can also be placed in a bag and put in a freezer overnight  Despite the above measures, it is impossible to eliminate dust  mites or their allergen completely from your home.  With the above measures the burden of mites in your home can be diminished, with the goal of minimizing your allergic symptoms.  Success will be reached only when implementing and using all means together. Control of Dog or Cat Allergen  Avoidance is the best way to manage a dog or cat  allergy. If you have a dog or cat and are allergic to dog or cats, consider removing the dog or cat from the home. If you have a dog or cat but don't want to find it a new home, or if your family wants a pet even though someone in the household is allergic, here are some strategies that may help keep symptoms at bay:  Keep the pet out of your bedroom and restrict it to only a few rooms. Be advised that keeping the dog or cat in only one room will not limit the allergens to that room. Don't pet, hug or kiss the dog or cat; if you do, wash your hands with soap and water. High-efficiency particulate air (HEPA) cleaners run continuously in a bedroom or living room can reduce allergen levels over time. Regular use of a high-efficiency vacuum cleaner or a central vacuum can reduce allergen levels. Giving your dog or cat a bath at least once a week can reduce airborne allergen. Control of Mold Allergen   Mold and fungi can grow on a variety of surfaces provided certain temperature and moisture conditions exist.  Outdoor molds grow on plants, decaying vegetation and soil.  The major outdoor mold, Alternaria and Cladosporium, are found in very high numbers during hot and dry conditions.  Generally, a late Summer - Fall peak is seen for common outdoor fungal spores.  Rain will temporarily lower outdoor mold spore count, but counts rise rapidly when the rainy period ends.  The most important indoor molds are Aspergillus and Penicillium.  Dark, humid and poorly ventilated basements are ideal sites for mold growth.  The next most common sites of mold growth are the bathroom and the kitchen.  Outdoor (Seasonal) Mold Control  Use air conditioning and keep windows closed Avoid exposure to decaying vegetation. Avoid leaf raking. Avoid grain handling. Consider wearing a face mask if working in moldy areas.    Indoor (Perennial) Mold Control   Maintain humidity below 50%. Clean washable surfaces with 5% bleach  solution. Remove sources e.g. contaminated carpets.   Control of Cockroach Allergen  Cockroach allergen has been identified as an important cause of acute attacks of asthma, especially in urban settings.  There are fifty-five species of cockroach that exist in the [place], however only three, the [place], [place] species produce allergen that can affect patients with Asthma.  Allergens can be obtained from fecal particles, egg casings and secretions from cockroaches.    Remove food sources. Reduce access to water. Seal access and entry points. Spray runways with 0.5-1% Diazinon or Chlorpyrifos Blow boric acid power under stoves and refrigerator. Place bait stations (hydramethylnon) at feeding sites.

## 2023-10-01 NOTE — Progress Notes (Signed)
 FOLLOW UP Date of Service/Encounter:  10/01/23  Subjective:  Paul Mosley (DOB: April 17, 1981) is a 43 y.o. male who [facility] 10[facility]23 in re-evaluation of the following: allergic rhinitis, urticaria.  History obtained from: chart review and patient.  For Review, LV was on 04/02/23  with Dr.Stephenie Navejas seen for routine follow-up. See below for summary of history and diagnostics.   Therapeutic plans/changes recommended: doing well, interested in AIT. ----------------------------------------------------- Pertinent History/Diagnostics:  Allergic Rhinitis:  He is from [place], came here around 9 years ago. Symptoms started in [place]. Symptoms include: nasal congestion, rhinorrhea, post nasal drainage, sneezing, watery eyes, itchy eyes, and itchy nose. Occurs seasonally-spring and fall. No ENT surgeries. Infrequent heart burn. - SPT environmental panel (12/24/22): positive to grass pollen, weed pollen, tree pollen, dust mites, indoor/outdoor molds, dog, mixed feathers, horse, cockroach  -Current Meds: Dymista nasal spray and Xyzal, Pataday as needed Urticaria:  Occasionally will break out into hives on his back.  Happens once every couple of months.  Lasts around one week. He will take benadryl which helps some.  No bruising. Significant itching, and the more he scratches, the bigger hives become. He sometimes wonders if pork does it, but no specific triggers. He eats pork regularly and does not always have symptoms. -Based on history reassurance provided that pork cannot trigger for symptoms. Current meds: Xyzal-discussed using high doses for hives including up to 4 tablets daily. --------------------------------------------------- Today presents for follow-up. Discussed the use of AI scribe software for clinical note transcription with the patient, who gave verbal consent to proceed.  History of Present Illness   Paul Mosley is a 43 year old male who presents for  allergy shot consultation.  He experiences seasonal allergies, particularly during spring and fall, and has been using Dymista nasal spray regularly to manage symptoms. Last week, he experienced a sore throat and sneezing, prompting him to resume Xyzal (levocetirizine), which he had previously stopped. The resumption of Xyzal three to four times alleviated his symptoms.  He has a history of hives, with the last episode occurring approximately six months ago. He has not yet acquired an EpiPen for allergy injections.  No recent need for eye drops. He is awaiting new prescriptions for nasal spray, eye drops, and Xyzal to be sent to his preferred pharmacy at [facility].   He would like to start allergy injections to reduce symptoms and need for allergy medications.      All medications reviewed by clinical staff and updated in chart. No new pertinent medical or surgical history except as noted in HPI.  ROS: All others negative except as noted per HPI.   Objective:  BP 110/80 (BP Location: Left Arm, Patient Position: Sitting, Cuff Size: Normal)   Pulse 82   Temp 98.7 F (37.1 C) (Temporal)   Ht 5\' 7"  (1.702 m)   Wt 186 lb 6.4 oz (84.6 kg)   SpO2 97%   BMI 29.19 kg/m  Body mass index is 29.19 kg/m. Physical Exam: General Appearance:  Alert, cooperative, no distress, appears stated age  Head:  Normocephalic, without obvious abnormality, atraumatic  Eyes:  Conjunctiva clear, EOM's intact  Ears EACs normal bilaterally and normal TMs bilaterally  Nose: Nares normal, hypertrophic turbinates, normal mucosa, no visible anterior polyps, and septum midline  Throat: Lips, tongue normal; teeth and gums normal, normal posterior oropharynx  Neck: Supple, symmetrical  Lungs:   clear to auscultation bilaterally, Respirations unlabored, no coughing  Heart:  regular rate and rhythm and no murmur, Appears well perfused  Extremities: No edema  Skin: Skin color, texture, turgor normal and  no rashes or lesions on visualized portions of skin  Neurologic: No gross deficits   Labs:  Lab Orders  No laboratory test(s) ordered today    Assessment/Plan   Seasonal and Perennial Allergic: - allergy testing 01/03/23 positive to grass pollen, weed pollen, tree pollen, dust mites, indoor/outdoor molds, dog, mixed feathers, horse, cockroach - Prevention:  - allergen avoidance when possible - consider allergy shots as long term control of your symptoms by teaching your immune system to be more tolerant of your allergy triggers - paperwork and consent given today, we will call you to schedule - Symptom control: - Continue Dymista 1 spray twice daily as needed - Continue Xyzal (Levocetirinze) 5mg  daily as needed  Allergic Conjunctivitis:  - Continue Allergy Eye drops-pataday 1 drop each eye daily as needed  -Avoid eye drops that say red eye relief as they may contain medications that dry out your eyes.  Chronic Idiopathic Urticaria: Therapy Plan:  - continue xyzal (levocetirizine)  5 mg once daily - if hives are uncontrolled, increase xyzal (levocetirizine)  5 mg twice daily - if hives remain uncontrolled, increase dose of xyzal (levocetirizine)  10 mg (2 pills) twice daily- this is maximum dose - can increase or decrease dosing depending on symptom control to a maximum dose of 4 tablets of antihistamine daily. Wait until hives free for at least one month prior to decreasing dose.   - if hives are still uncontrolled with the above regimen, please arrange an appointment for discussion of Xolair (omalizumab)- an injectable medication for hives  Can use one of the following in place of zyrtec if desires: Claritin (loratadine) 10 mg, Xyzal (levocetirizine) 5 mg or Allegra (fexofenadine) 180 mg daily as needed  Follow up : 6 months, sooner if needed Sooner for allergy injections. It was a pleasure seeing you again in clinic today! Thank you for allowing me to participate in your  care.  Tonny Bollman, MD [facility]  Other: none  Tonny Bollman, MD  [facility]

## 2023-10-05 ENCOUNTER — Other Ambulatory Visit: Payer: Self-pay | Admitting: Internal Medicine

## 2023-10-05 DIAGNOSIS — J302 Other seasonal allergic rhinitis: Secondary | ICD-10-CM

## 2023-10-05 MED ORDER — FAMOTIDINE 20 MG PO TABS: ORAL_TABLET | ORAL | 0 refills | Status: DC

## 2023-10-05 MED ORDER — MONTELUKAST SODIUM 10 MG PO TABS: ORAL_TABLET | ORAL | 0 refills | Status: DC

## 2023-10-05 MED ORDER — PREDNISONE 20 MG PO TABS: ORAL_TABLET | ORAL | 0 refills | Status: DC

## 2023-10-05 NOTE — Telephone Encounter (Signed)
 Pt called and is ok with 4/4 @ 8:30, appt scheduled.

## 2023-10-05 NOTE — Telephone Encounter (Signed)
 [facility] AIT Rx written.

## 2023-10-05 NOTE — Progress Notes (Signed)
 [facility] AIT Rx ready.

## 2023-10-05 NOTE — Addendum Note (Signed)
 Addended by: Dub Mikes on: 10/05/2023 11:21 AM   Modules accepted: Orders

## 2023-10-05 NOTE — Telephone Encounter (Signed)
 Called and left patient a voicemail to see if he is available for 10/12/2023 @ 8:30 with Dr. Allena Katz for R[facility] ([place]).

## 2023-10-08 DIAGNOSIS — J301 Allergic rhinitis due to pollen: Secondary | ICD-10-CM | POA: Diagnosis not present

## 2023-10-08 NOTE — Progress Notes (Signed)
 Aeroallergen Immunotherapy  Ordering Provider: Dr. Tonny Bollman  Patient Details Name: Paul Mosley MRN: [id number] Date of Birth: [birth year]  Order 2 of 2  Vial Label: M/D/H/CR/DM  0.2 ml (Volume)  1:10 Concentration -- Penicillium mix 0.2 ml (Volume)  1:20 Concentration -- Bipolaris sorokiniana 0.2 ml (Volume)  1:20 Concentration -- Drechslera spicifera 0.2 ml (Volume)  1:10 Concentration -- Mucor plumbeus 0.2 ml (Volume)  1:10 Concentration -- Fusarium moniliforme 0.3 ml (Volume)  1:20 Concentration -- Cockroach, German 0.5 ml (Volume)  1:10 Concentration -- Dog Epithelia 0.3 ml (Volume)  1:10 Concentration -- Horse Epithelia 0.5 ml (Volume)   AU Concentration -- Mite Mix (DF 5,000 & DP 5,000)   2.6  ml Extract Subtotal 2.4  ml Diluent 5.0  ml Maintenance Total  Schedule:  RUSH Silver Vial (1:1,000,000): RUSH Blue Vial (1:100,000): RUSH Yellow Vial (1:10,000): RUSH Green Vial (1:1,000): Schedule B (6 doses) Red Vial (1:100): Schedule A (10 doses)  Special Instructions: RUSH, green B, red A, then maintenance protocol After completion of the first Red Vial, please space to every two weeks. After completion of the second Red Vial, please space to every 4 weeks. Ok to up dose new vials at 0.31mL --> 0.3 mL --> 0.5 mL.

## 2023-10-08 NOTE — Progress Notes (Signed)
 Aeroallergen Immunotherapy  Ordering Provider: Dr. Tonny Bollman  Patient Details Name: Paul Mosley MRN: [id number] Date of Birth: Feb 05, 1981  Order 1 of 2  Vial Label: G/W/T  0.3 ml (Volume)  BAU Concentration -- 7 Grass Mix* 100,000 ([address] [place], [place], [place], [place], Red[place], Sweet Vernal, Timothy) 0.2 ml (Volume)  1:20 Concentration -- [place] 0.3 ml (Volume)  BAU Concentration -- [place] 10,000 0.2 ml (Volume)  1:20 Concentration -- Johnson 0.3 ml (Volume)  1:20 Concentration -- Ragweed Mix 0.2 ml (Volume)  1:20 Concentration -- Cocklebur 0.2 ml (Volume)  1:10 Concentration -- Plantain English 0.2 ml (Volume)  1:20 Concentration -- Lamb's Quarters* 0.2 ml (Volume)  1:20 Concentration -- Mugwort, Common* 0.2 ml (Volume)  1:20 Concentration -- Gu[place] Thistle 0.2 ml (Volume)  1:80 Concentration -- Dogfennel 0.5 ml (Volume)  1:20 Concentration -- Eastern 10 Tree Mix (also Sweet Gum) 0.2 ml (Volume)  1:20 Concentration -- Box Elder 0.2 ml (Volume)  1:10 Concentration -- Pecan Pollen   3.4  ml Extract Subtotal 1.6  ml Diluent 5.0  ml Maintenance Total  Schedule:  RUSH Silver Vial (1:1,000,000): RUSH Blue Vial (1:100,000): RUSH Yellow Vial (1:10,000): RUSH Green Vial (1:1,000): Schedule B (6 doses) Red Vial (1:100): Schedule A (10 doses)  Special Instructions: RUSH, green B, red A, then maintenance protocol After completion of the first Red Vial, please space to every two weeks. After completion of the second Red Vial, please space to every 4 weeks. Ok to up dose new vials at 0.61mL --> 0.3 mL --> 0.5 mL.

## 2023-10-08 NOTE — Progress Notes (Signed)
 VIAL SET 1 G-W-T MADE 10-08-23. EXP 10-07-24

## 2023-10-09 DIAGNOSIS — J3081 Allergic rhinitis due to animal (cat) (dog) hair and dander: Secondary | ICD-10-CM | POA: Diagnosis not present

## 2023-10-09 NOTE — Progress Notes (Signed)
 VIAL SET TWO M-D-H-CR-DM MADE 10-09-23. EXP 10-08-24

## 2023-10-12 ENCOUNTER — Encounter: Payer: Self-pay | Admitting: Internal Medicine

## 2023-10-12 ENCOUNTER — Ambulatory Visit: Admitting: Internal Medicine

## 2023-10-12 VITALS — BP 108/78 | HR 58 | Temp 98.0°F | Resp 16

## 2023-10-12 DIAGNOSIS — J302 Other seasonal allergic rhinitis: Secondary | ICD-10-CM | POA: Diagnosis not present

## 2023-10-12 DIAGNOSIS — J3089 Other allergic rhinitis: Secondary | ICD-10-CM

## 2023-10-12 NOTE — Progress Notes (Signed)
 RAPID DESENSITIZATION Note  RE: Paul Mosley MRN: [id number] DOB: 11/18/1980 Date of Office Visit: 10/12/2023  Subjective:  Patient presents today for rapid desensitization.  Interval History: Patient has not been ill, he has taken all premedications as per protocol.  Recent/Current History: Pulmonary disease: no Cardiac disease: no Respiratory infection: no Rash: no Swelling: no Cough: no Shortness of breath: no Runny/stuffy nose: no Itchy eyes: no Beta-blocker use: no  Patient/guardian was informed of the procedure with verbalized understanding of the risk of anaphylaxis. Consent has been signed.   Medication List:  Current Outpatient Medications  Medication Sig Dispense Refill   aspirin EC 325 MG tablet Take 325 mg by mouth as needed. (Patient not taking: Reported on 10/01/2023)     Azelastine-Fluticasone (DYMISTA) 137-50 MCG/ACT SUSP Place 1 spray into both nostrils 2 (two) times daily as needed. 23 g 5   cyclobenzaprine (FLEXERIL) 10 MG tablet TAKE 1 TABLET BY MOUTH TWICE A DAY AS NEEDED FOR MUSCLE SPASMS (Patient not taking: Reported on 10/01/2023)     EPINEPHrine (EPIPEN 2-PAK) 0.3 mg/0.3 mL IJ SOAJ injection Inject 0.3 mg into the muscle as needed for anaphylaxis. Bring to your allergy injection appointments. (Patient not taking: Reported on 10/01/2023) 2 each 2   famotidine (PEPCID) 20 MG tablet Take 20 mg the morning and evening before and 20 mg the morning and evening of [facility]. 4 tablet 0   ibuprofen (ADVIL) 800 MG tablet Take 1 tablet (800 mg total) by mouth every 8 (eight) hours as needed. 21 tablet 0   levocetirizine (XYZAL) 5 MG tablet Take 1 tablet (5 mg total) by mouth every evening. 30 tablet 5   montelukast (SINGULAIR) 10 MG tablet Take 10 mg the morning before and 10 mg the morning of [facility]. 2 tablet 0   Olopatadine HCl (PATADAY) 0.2 % SOLN Place 1 drop into both eyes daily as needed. 2.5 mL 5   predniSONE (DELTASONE) 20 MG tablet Take  40 mg (2 tablets) the morning before and 40 mg (2 tablets) the morning of [facility]. 4 tablet 0   traZODone (DESYREL) 50 MG tablet Take 1 tablet (50 mg total) by mouth at bedtime. (Patient not taking: Reported on 10/01/2023) 30 tablet 0   No current facility-administered medications for this visit.   Allergies: No Known Allergies   Objective: There were no vitals taken for this visit. There is no height or weight on file to calculate BMI.  General Appearance:  Alert, cooperative, no distress, appears stated age  Head:  Normocephalic, without obvious abnormality, atraumatic  Eyes:  Conjunctiva clear, EOM's intact  Nose: Nares normal  Throat: MMM, slight cobblestoning   Neck: Supple  Lungs:   CTAB without wheezes or crackles, no coughing, unlabored   Heart:  Regular rate  Skin: No apparent rashes.        Diagnostics:  PROCEDURES:  Patient received the following doses every hour: Step 1:  0.52ml - 1:1,000,000 dilution (silver vial) Step 2:  0.35ml - 1:1,000,000 dilution (silver vial) Step 3: 0.41ml - 1:100,000 dilution (blue vial)  Step 4: 0.56ml - 1:100,000 dilution (blue vial)  Step 5: 0.70ml - 1:10,000 dilution (gold vial) Step 6: 0.24ml - 1:10,000 dilution (gold vial) Step 7: 0.76ml - 1:10,000 dilution (gold vial) Step 8: 0.39ml - 1:10,000 dilution (gold vial)  Patient was observed for 1 hour after the last dose.   Procedure started at 8:25 AM Procedure ended at 2:32 PM   ASSESSMENT/PLAN:   Patient has tolerated  the rapid desensitization protocol.  Next appointment: Start at 0.57ml of 1:1000 dilution (green vial) and build up per protocol.

## 2023-10-19 ENCOUNTER — Ambulatory Visit (INDEPENDENT_AMBULATORY_CARE_PROVIDER_SITE_OTHER)

## 2023-10-19 DIAGNOSIS — J309 Allergic rhinitis, unspecified: Secondary | ICD-10-CM | POA: Diagnosis not present

## 2023-11-02 ENCOUNTER — Ambulatory Visit (INDEPENDENT_AMBULATORY_CARE_PROVIDER_SITE_OTHER): Payer: Self-pay

## 2023-11-02 DIAGNOSIS — J309 Allergic rhinitis, unspecified: Secondary | ICD-10-CM | POA: Diagnosis not present

## 2023-11-08 ENCOUNTER — Ambulatory Visit (INDEPENDENT_AMBULATORY_CARE_PROVIDER_SITE_OTHER): Payer: Self-pay

## 2023-11-08 DIAGNOSIS — J309 Allergic rhinitis, unspecified: Secondary | ICD-10-CM | POA: Diagnosis not present

## 2023-11-15 ENCOUNTER — Ambulatory Visit (INDEPENDENT_AMBULATORY_CARE_PROVIDER_SITE_OTHER): Payer: Self-pay

## 2023-11-15 DIAGNOSIS — J309 Allergic rhinitis, unspecified: Secondary | ICD-10-CM | POA: Diagnosis not present

## 2023-11-22 ENCOUNTER — Ambulatory Visit (INDEPENDENT_AMBULATORY_CARE_PROVIDER_SITE_OTHER)

## 2023-11-22 DIAGNOSIS — J309 Allergic rhinitis, unspecified: Secondary | ICD-10-CM | POA: Diagnosis not present

## 2023-11-29 ENCOUNTER — Ambulatory Visit (INDEPENDENT_AMBULATORY_CARE_PROVIDER_SITE_OTHER)

## 2023-11-29 DIAGNOSIS — J309 Allergic rhinitis, unspecified: Secondary | ICD-10-CM

## 2023-12-06 ENCOUNTER — Ambulatory Visit (INDEPENDENT_AMBULATORY_CARE_PROVIDER_SITE_OTHER)

## 2023-12-06 DIAGNOSIS — J309 Allergic rhinitis, unspecified: Secondary | ICD-10-CM

## 2023-12-13 ENCOUNTER — Ambulatory Visit (INDEPENDENT_AMBULATORY_CARE_PROVIDER_SITE_OTHER): Payer: Self-pay

## 2023-12-13 DIAGNOSIS — J309 Allergic rhinitis, unspecified: Secondary | ICD-10-CM

## 2023-12-20 ENCOUNTER — Ambulatory Visit (INDEPENDENT_AMBULATORY_CARE_PROVIDER_SITE_OTHER): Payer: Self-pay

## 2023-12-20 DIAGNOSIS — J309 Allergic rhinitis, unspecified: Secondary | ICD-10-CM | POA: Diagnosis not present

## 2023-12-26 ENCOUNTER — Encounter: Payer: Self-pay | Admitting: Family

## 2023-12-26 ENCOUNTER — Ambulatory Visit (INDEPENDENT_AMBULATORY_CARE_PROVIDER_SITE_OTHER): Payer: Medicaid Other | Admitting: Family

## 2023-12-26 VITALS — BP 130/88 | HR 74 | Temp 98.4°F | Resp 16 | Ht 67.0 in | Wt 180.8 lb

## 2023-12-26 DIAGNOSIS — Z13228 Encounter for screening for other metabolic disorders: Secondary | ICD-10-CM

## 2023-12-26 DIAGNOSIS — K921 Melena: Secondary | ICD-10-CM

## 2023-12-26 DIAGNOSIS — Z0000 Encounter for general adult medical examination without abnormal findings: Secondary | ICD-10-CM | POA: Diagnosis not present

## 2023-12-26 DIAGNOSIS — K649 Unspecified hemorrhoids: Secondary | ICD-10-CM

## 2023-12-26 DIAGNOSIS — Z130 Encounter for screening for diseases of the blood and blood-forming organs and certain disorders involving the immune mechanism: Secondary | ICD-10-CM | POA: Diagnosis not present

## 2023-12-26 DIAGNOSIS — Z131 Encounter for screening for diabetes mellitus: Secondary | ICD-10-CM | POA: Diagnosis not present

## 2023-12-26 DIAGNOSIS — Z13220 Encounter for screening for lipoid disorders: Secondary | ICD-10-CM

## 2023-12-26 DIAGNOSIS — Z1329 Encounter for screening for other suspected endocrine disorder: Secondary | ICD-10-CM

## 2023-12-26 MED ORDER — HYDROCORTISONE (PERIANAL) 2.5 % EX CREA: 1.0000 | TOPICAL_CREAM | Freq: Two times a day (BID) | CUTANEOUS | 1 refills | Status: AC

## 2023-12-26 NOTE — Progress Notes (Signed)
 Patient ID: Paul Mosley, male    DOB: 07/17/1980  MRN: [id number]  CC: Annual Exam   Subjective: Paul Mosley is a 43 y.o. male who presents for annual exam.   His concerns today include:  Reports external hemorrhoids and sometimes blood in stool. Denies red flag symptoms.  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 23 g 5   cyclobenzaprine  (FLEXERIL ) 10 MG tablet      EPINEPHrine  (EPIPEN  2-PAK) 0.3 mg/0.3 mL IJ SOAJ injection Inject 0.3 mg into the muscle as needed for anaphylaxis. Bring to your allergy  injection appointments. 2 each 2   famotidine  (PEPCID ) 20 MG tablet Take 20 mg the morning and evening before and 20 mg the morning and evening of [facility]. 4 tablet 0   ibuprofen  (ADVIL ) 800 MG tablet Take 1 tablet (800 mg total) by mouth every 8 (eight) hours as needed. 21 tablet 0   levocetirizine (XYZAL ) 5 MG tablet Take 1 tablet (5 mg total) by mouth every evening. 30 tablet 5   montelukast  (SINGULAIR ) 10 MG tablet Take 10 mg the morning before and 10 mg the morning of [facility]. 2 tablet 0   Olopatadine  HCl (PATADAY ) 0.2 % SOLN Place 1 drop into both eyes daily as needed. 2.5 mL 5   predniSONE  (DELTASONE ) 20 MG tablet Take 40 mg (2 tablets) the morning before and 40 mg (2 tablets) the morning of [facility]. 4 tablet 0   traZODone  (DESYREL ) 50 MG tablet Take 1 tablet (50 mg total) by mouth at bedtime. 30 tablet 0   aspirin EC 325 MG tablet Take 325 mg by mouth as needed.     No current facility-administered medications on file prior to visit.     Allergies  Allergen Reactions   Bee Pollen Itching   Grass Pollen(K-O-R-T-Swt Vern) Hives and Itching   Tree Extract Itching    Social History   Socioeconomic History   Marital status: Married    Spouse name: Not on file   Number of children: Not on file   Years of education: Not on file   Highest education level: Not on file  Occupational History   Not on file  Tobacco Use   Smoking status: Never   Smokeless tobacco: Never  Vaping Use   Vaping status: Never Used  Substance and Sexual Activity   Alcohol use: Yes    Comment: occassionally   Drug use: No   Sexual activity: Yes  Other Topics Concern   Not on file  Social History Narrative   Married, has 2 biologic children, 4 stepchildren.   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   From [place] orig, lived in [place] x 5 yrs prior to coming to [place]  2015.   No tob, occ alc, no drugs.   Social Drivers of Corporate investment banker Strain: Low Risk  (12/26/2023)   Overall Financial Resource Strain (CARDIA)    Difficulty of Paying Living Expenses: Not hard at all  Food Insecurity: No Food Insecurity (12/26/2023)   Hunger Vital Sign    Worried About Running  Out of Food in the Last Year: Never true    Ran Out of Food in the Last Year: Never true  Transportation Needs: No Transportation Needs (12/26/2023)   PRAPARE - Administrator, Civil Service (Medical): No    Lack of Transportation (Non-Medical): No  Physical Activity: Inactive (12/26/2023)   Exercise Vital Sign    Days of Exercise per Week: 0 days    Minutes of Exercise per Session: 0 min  Stress: No Stress Concern Present (12/26/2023)   [employer] of Occupational Health - Occupational Stress Questionnaire    Feeling of Stress: Not at all  Social Connections: Moderately Integrated (12/26/2023)   Social Connection and Isolation Panel    Frequency of Communication with Friends and Family: More than three times a week    Frequency of  Social Gatherings with Friends and Family: More than three times a week    Attends Religious Services: 1 to 4 times per year    Active Member of [employer] or Organizations: No    Attends Banker Meetings: Never    Marital Status: Married  Catering manager Violence: Not At Risk (12/26/2023)   Humiliation, Afraid, Rape, and Kick questionnaire    Fear of Current or Ex-Partner: No    Emotionally Abused: No    Physically Abused: No    Sexually Abused: No    Family History  Problem Relation Age of Onset   Transient ischemic attack Brother 60    History reviewed. No pertinent surgical history.  ROS: Review of Systems Negative except as stated above  PHYSICAL EXAM: BP 130/88   Pulse 74   Temp 98.4 F (36.9 C) (Oral)   Resp 16   Ht 5' 7 (1.702 m)   Wt 180 lb 12.8 oz (82 kg)   SpO2 95%   BMI 28.32 kg/m   Physical Exam HENT:     Head: Normocephalic and atraumatic.     Right Ear: Tympanic membrane, ear canal and external ear normal.     Left Ear: Tympanic membrane, ear canal and external ear normal.     Nose: Nose normal.     Mouth/Throat:     Mouth: Mucous membranes are moist.     Pharynx: Oropharynx is clear.   Eyes:     Extraocular Movements: Extraocular movements intact.     Conjunctiva/sclera: Conjunctivae normal.     Pupils: Pupils are equal, round, and reactive to light.   Neck:     Thyroid : No thyroid  mass, thyromegaly or thyroid  tenderness.   Cardiovascular:     Rate and Rhythm: Normal rate and regular rhythm.     Pulses: Normal pulses.     Heart sounds: Normal heart sounds.  Pulmonary:     Effort: Pulmonary effort is normal.     Breath sounds: Normal breath sounds.  Abdominal:     General: Bowel sounds are normal.     Palpations: Abdomen is soft.  Genitourinary:    Comments: Patient declined.  Musculoskeletal:        General: Normal range of motion.     Right shoulder: Normal.     Left shoulder: Normal.     Right upper arm: Normal.      Left upper arm: Normal.     Right elbow: Normal.     Left elbow: Normal.     Right forearm: Normal.     Left forearm: Normal.     Right wrist: Normal.     Left wrist: Normal.  Right hand: Normal.     Left hand: Normal.     Cervical back: Normal, normal range of motion and neck supple.     Thoracic back: Normal.     Lumbar back: Normal.     Right hip: Normal.     Left hip: Normal.     Right upper leg: Normal.     Left upper leg: Normal.     Right knee: Normal.     Left knee: Normal.     Right lower leg: Normal.     Left lower leg: Normal.     Right ankle: Normal.     Left ankle: Normal.     Right foot: Normal.     Left foot: Normal.   Skin:    General: Skin is warm and dry.     Capillary Refill: Capillary refill takes less than 2 seconds.   Neurological:     General: No focal deficit present.     Mental Status: He is alert and oriented to person, place, and time.   Psychiatric:        Mood and Affect: Mood normal.        Behavior: Behavior normal.    ASSESSMENT AND PLAN: 1. Annual physical exam (Primary) - Counseled on 150 minutes of exercise per week as tolerated, healthy eating (including decreased daily intake of saturated fats, cholesterol, added sugars, sodium), STI prevention, and routine healthcare maintenance.  2. Screening for metabolic disorder - Routine screening.  - CMP14+EGFR  3. Screening for deficiency anemia - Routine screening.  - CBC  4. Diabetes mellitus screening - Routine screening.  - Hemoglobin A1c  5. Screening cholesterol level - Routine screening.  - Lipid panel  6. Thyroid  disorder screen - Routine screening.  - TSH  7. Hemorrhoids, unspecified hemorrhoid type 8. Blood in stool - Hydrocortisone as prescribed. Counseled on medication adherence/adverse effects.  - Referral to Gastroenterology for evaluation/management.  - Follow-up with primary provider as scheduled. - Ambulatory referral to Gastroenterology -  hydrocortisone (ANUSOL-HC) 2.5 % rectal cream; Place 1 Application rectally 2 (two) times daily.  Dispense: 60 g; Refill: 1   Patient was given the opportunity to ask questions.  Patient verbalized understanding of the plan and was able to repeat key elements of the plan. Patient was given clear instructions to go to Emergency Department or return to medical center if symptoms don't improve, worsen, or new problems develop.The patient verbalized understanding.   Orders Placed This Encounter  Procedures   CBC   Lipid panel   CMP14+EGFR   Hemoglobin A1c   TSH   Ambulatory referral to Gastroenterology     Requested Prescriptions   Signed Prescriptions Disp Refills   hydrocortisone (ANUSOL-HC) 2.5 % rectal cream 60 g 1    Sig: Place 1 Application rectally 2 (two) times daily.    Return in about 1 year (around 12/25/2024) for Physical per patient preference.  Senaida Dama, NP

## 2023-12-26 NOTE — Progress Notes (Signed)
 No concerns.

## 2023-12-27 ENCOUNTER — Ambulatory Visit (INDEPENDENT_AMBULATORY_CARE_PROVIDER_SITE_OTHER): Payer: Self-pay

## 2023-12-27 ENCOUNTER — Other Ambulatory Visit: Payer: Self-pay | Admitting: Medical Genetics

## 2023-12-27 DIAGNOSIS — J309 Allergic rhinitis, unspecified: Secondary | ICD-10-CM

## 2023-12-27 LAB — HEMOGLOBIN A1C
Est. average glucose Bld gHb Est-mCnc: 108 mg/dL
Hgb A1c MFr Bld: 5.4 % (ref 4.8–5.6)

## 2023-12-27 LAB — CBC
Hematocrit: 42.1 % (ref 37.5–51.0)
Hemoglobin: 13.8 g/dL (ref 13.0–17.7)
MCH: 29.6 pg (ref 26.6–33.0)
MCHC: 32.8 g/dL (ref 31.5–35.7)
MCV: 90 fL (ref 79–97)
Platelets: 198 10*3/uL (ref 150–450)
RBC: 4.67 x10E6/uL (ref 4.14–5.80)
RDW: 14 % (ref 11.6–15.4)
WBC: 3.4 10*3/uL (ref 3.4–10.8)

## 2023-12-27 LAB — CMP14+EGFR
ALT: 17 IU/L (ref 0–44)
AST: 22 IU/L (ref 0–40)
Albumin: 4.7 g/dL (ref 4.1–5.1)
Alkaline Phosphatase: 63 IU/L (ref 44–121)
BUN/Creatinine Ratio: 13 (ref 9–20)
BUN: 13 mg/dL (ref 6–24)
Bilirubin Total: 0.5 mg/dL (ref 0.0–1.2)
CO2: 20 mmol/L (ref 20–29)
Calcium: 9.3 mg/dL (ref 8.7–10.2)
Chloride: 104 mmol/L (ref 96–106)
Creatinine, Ser: 0.98 mg/dL (ref 0.76–1.27)
Globulin, Total: 2.4 g/dL (ref 1.5–4.5)
Glucose: 103 mg/dL — ABNORMAL HIGH (ref 70–99)
Potassium: 4.4 mmol/L (ref 3.5–5.2)
Sodium: 139 mmol/L (ref 134–144)
Total Protein: 7.1 g/dL (ref 6.0–8.5)
eGFR: 98 mL/min/{1.73_m2} (ref >59)

## 2023-12-27 LAB — LIPID PANEL
Chol/HDL Ratio: 2.5 ratio (ref 0.0–5.0)
Cholesterol, Total: 192 mg/dL (ref 100–199)
HDL: 78 mg/dL (ref >39)
LDL Chol Calc (NIH): 91 mg/dL (ref 0–99)
Triglycerides: 135 mg/dL (ref 0–149)
VLDL Cholesterol Cal: 23 mg/dL (ref 5–40)

## 2023-12-27 LAB — TSH: TSH: 2.05 u[IU]/mL (ref 0.450–4.500)

## 2023-12-31 ENCOUNTER — Other Ambulatory Visit (HOSPITAL_COMMUNITY)

## 2023-12-31 ENCOUNTER — Ambulatory Visit: Payer: Self-pay | Admitting: Family

## 2024-01-01 ENCOUNTER — Other Ambulatory Visit (HOSPITAL_COMMUNITY)
Admission: RE | Admit: 2024-01-01 | Discharge: 2024-01-01 | Disposition: A | Discharge status: Home / Self Care | Payer: Self-pay | Source: Ambulatory Visit | Attending: Medical Genetics | Admitting: Medical Genetics

## 2024-01-03 ENCOUNTER — Ambulatory Visit (INDEPENDENT_AMBULATORY_CARE_PROVIDER_SITE_OTHER): Payer: Self-pay

## 2024-01-03 DIAGNOSIS — J309 Allergic rhinitis, unspecified: Secondary | ICD-10-CM | POA: Diagnosis not present

## 2024-01-10 ENCOUNTER — Ambulatory Visit (INDEPENDENT_AMBULATORY_CARE_PROVIDER_SITE_OTHER)

## 2024-01-10 DIAGNOSIS — J309 Allergic rhinitis, unspecified: Secondary | ICD-10-CM

## 2024-01-11 LAB — GENECONNECT MOLECULAR SCREEN: Genetic Analysis Overall Interpretation: NEGATIVE

## 2024-01-14 ENCOUNTER — Other Ambulatory Visit: Payer: Self-pay | Admitting: Family

## 2024-01-14 DIAGNOSIS — J3089 Other allergic rhinitis: Secondary | ICD-10-CM

## 2024-01-14 DIAGNOSIS — L509 Urticaria, unspecified: Secondary | ICD-10-CM

## 2024-01-15 NOTE — Telephone Encounter (Signed)
 Complete

## 2024-01-17 ENCOUNTER — Ambulatory Visit

## 2024-01-17 DIAGNOSIS — J309 Allergic rhinitis, unspecified: Secondary | ICD-10-CM | POA: Diagnosis not present

## 2024-01-24 ENCOUNTER — Ambulatory Visit

## 2024-01-24 DIAGNOSIS — J309 Allergic rhinitis, unspecified: Secondary | ICD-10-CM | POA: Diagnosis not present

## 2024-01-31 ENCOUNTER — Ambulatory Visit (INDEPENDENT_AMBULATORY_CARE_PROVIDER_SITE_OTHER)

## 2024-01-31 DIAGNOSIS — J309 Allergic rhinitis, unspecified: Secondary | ICD-10-CM

## 2024-02-07 ENCOUNTER — Ambulatory Visit (INDEPENDENT_AMBULATORY_CARE_PROVIDER_SITE_OTHER)

## 2024-02-07 DIAGNOSIS — J309 Allergic rhinitis, unspecified: Secondary | ICD-10-CM | POA: Diagnosis not present

## 2024-02-14 ENCOUNTER — Ambulatory Visit (INDEPENDENT_AMBULATORY_CARE_PROVIDER_SITE_OTHER)

## 2024-02-14 DIAGNOSIS — J309 Allergic rhinitis, unspecified: Secondary | ICD-10-CM

## 2024-02-26 ENCOUNTER — Other Ambulatory Visit: Payer: Self-pay | Admitting: Family

## 2024-02-26 DIAGNOSIS — G4700 Insomnia, unspecified: Secondary | ICD-10-CM

## 2024-02-26 NOTE — Telephone Encounter (Signed)
 Complete

## 2024-02-28 ENCOUNTER — Ambulatory Visit (INDEPENDENT_AMBULATORY_CARE_PROVIDER_SITE_OTHER)

## 2024-02-28 DIAGNOSIS — J309 Allergic rhinitis, unspecified: Secondary | ICD-10-CM

## 2024-03-06 DIAGNOSIS — J301 Allergic rhinitis due to pollen: Secondary | ICD-10-CM | POA: Diagnosis not present

## 2024-03-06 NOTE — Progress Notes (Signed)
 VIALS MADE 03-06-24

## 2024-03-07 DIAGNOSIS — J302 Other seasonal allergic rhinitis: Secondary | ICD-10-CM | POA: Diagnosis not present

## 2024-03-07 DIAGNOSIS — J3089 Other allergic rhinitis: Secondary | ICD-10-CM | POA: Diagnosis not present

## 2024-03-07 DIAGNOSIS — J3081 Allergic rhinitis due to animal (cat) (dog) hair and dander: Secondary | ICD-10-CM | POA: Diagnosis not present

## 2024-03-13 ENCOUNTER — Encounter: Payer: Self-pay | Admitting: Family

## 2024-03-13 ENCOUNTER — Ambulatory Visit (INDEPENDENT_AMBULATORY_CARE_PROVIDER_SITE_OTHER)

## 2024-03-13 DIAGNOSIS — J309 Allergic rhinitis, unspecified: Secondary | ICD-10-CM | POA: Diagnosis not present

## 2024-03-27 ENCOUNTER — Ambulatory Visit (INDEPENDENT_AMBULATORY_CARE_PROVIDER_SITE_OTHER)

## 2024-03-27 DIAGNOSIS — J309 Allergic rhinitis, unspecified: Secondary | ICD-10-CM | POA: Diagnosis not present

## 2024-03-31 ENCOUNTER — Telehealth: Payer: Self-pay

## 2024-03-31 ENCOUNTER — Ambulatory Visit (INDEPENDENT_AMBULATORY_CARE_PROVIDER_SITE_OTHER): Admitting: Internal Medicine

## 2024-03-31 ENCOUNTER — Encounter: Payer: Self-pay | Admitting: Internal Medicine

## 2024-03-31 ENCOUNTER — Other Ambulatory Visit: Payer: Self-pay

## 2024-03-31 ENCOUNTER — Other Ambulatory Visit (HOSPITAL_COMMUNITY): Payer: Self-pay

## 2024-03-31 VITALS — BP 112/74 | HR 53 | Temp 98.1°F | Ht 67.0 in | Wt 182.1 lb

## 2024-03-31 DIAGNOSIS — J302 Other seasonal allergic rhinitis: Secondary | ICD-10-CM

## 2024-03-31 DIAGNOSIS — L508 Other urticaria: Secondary | ICD-10-CM | POA: Diagnosis not present

## 2024-03-31 DIAGNOSIS — J3089 Other allergic rhinitis: Secondary | ICD-10-CM

## 2024-03-31 DIAGNOSIS — H1013 Acute atopic conjunctivitis, bilateral: Secondary | ICD-10-CM

## 2024-03-31 MED ORDER — OLOPATADINE HCL 0.2 % OP SOLN: 1.0000 [drp] | Freq: Every day | OPHTHALMIC | 5 refills | Status: AC | PRN

## 2024-03-31 MED ORDER — AZELASTINE-FLUTICASONE 137-50 MCG/ACT NA SUSP: 1.0000 | Freq: Two times a day (BID) | NASAL | 5 refills | Status: AC | PRN

## 2024-03-31 MED ORDER — EPINEPHRINE 0.3 MG/0.3ML IJ SOAJ: 0.3000 mg | INTRAMUSCULAR | 2 refills | Status: AC | PRN

## 2024-03-31 MED ORDER — LEVOCETIRIZINE DIHYDROCHLORIDE 5 MG PO TABS: 5.0000 mg | ORAL_TABLET | Freq: Every evening | ORAL | 5 refills | Status: AC

## 2024-03-31 NOTE — Telephone Encounter (Signed)
*  AA  Pharmacy Patient Advocate Encounter   Received notification from CoverMyMeds that prior authorization for Azelastine -Fluticasone  137-50MCG/ACT suspension  is required/requested.   Insurance verification completed.   The patient is insured through [facility] .   Per test claim: The current 30 day co-pay is, $4.00.  No PA needed at this time. This test claim was processed through [facility]- copay amounts may vary at other pharmacies due to pharmacy/plan contracts, or as the patient moves through the different stages of their insurance plan.     *Brand Dymista  is covered

## 2024-03-31 NOTE — Progress Notes (Signed)
 FOLLOW UP Date of Service/Encounter:   03/31/2024  Subjective:  Paul Mosley (DOB: Apr 07, 1981) is a 43 y.o. male who [facility] in re-evaluation of the following: allergic rhinitis, chronic urticaria History obtained from: chart review and patient.  For Review, LV was on 10/01/23  with Dr.Lorcan Shelp seen for routine follow-up. See below for summary of history and diagnostics.   Therapeutic plans/changes recommended: ready to start AIT ----------------------------------------------------- Pertinent History/Diagnostics:  Allergic Rhinitis:  He is from [place], came here around 9 years ago. Symptoms started in [place]. Symptoms include: nasal congestion, rhinorrhea, post nasal drainage, sneezing, watery eyes, itchy eyes, and itchy nose. Occurs seasonally-spring and fall. No ENT surgeries. Infrequent heart burn. - SPT environmental panel (12/24/22): positive to grass pollen, weed pollen, tree pollen, dust mites, indoor/outdoor molds, dog, mixed feathers, horse, cockroach  -Current Meds: Dymista  nasal spray and Xyzal , Pataday  as needed -[facility] started 10/12/23. 2 vials (1: G/W/T 2: M/D/H/CR/DM); reached full maintenance on 02/14/24 (0.5 mL or red vials) Urticaria:  Occasionally will break out into hives on his back.  Happens once every couple of months.  Lasts around one week. He will take benadryl which helps some.  No bruising. Significant itching, and the more he scratches, the bigger hives become. He sometimes wonders if pork does it, but no specific triggers. He eats pork regularly and does not always have symptoms. -Based on history reassurance provided that pork cannot trigger for symptoms. Current meds: Xyzal -discussed using high doses for hives including up to 4 tablets daily. --------------------------------------------------- Today presents for follow-up. Discussed the use of AI scribe software for clinical note transcription with the patient, who  gave verbal consent to proceed.  History of Present Illness Paul Mosley is a 43 year old male who presents for allergy  shot management and medication refill before traveling to [place].  Allergic rhinitis management - Receiving allergy  immunotherapy without any symptoms - Managing symptoms with Xyzal , not using nasal sprays - No recent hives - Plans to travel to [place] on Thursday and will return on October 31st, resulting in missing two scheduled allergy  shots  Auditory symptoms - Occasional ear ringing, present even when not using headphones  All medications reviewed by clinical staff and updated in chart. No new pertinent medical or surgical history except as noted in HPI.  ROS: All others negative except as noted per HPI.   Objective:  BP 112/74 (BP Location: Right Arm, Patient Position: Sitting, Cuff Size: Normal)   Pulse (!) 53   Temp 98.1 F (36.7 C) (Temporal)   Ht 5' 7 (1.702 m)   Wt 182 lb 1.6 oz (82.6 kg)   SpO2 98%   BMI 28.52 kg/m  Body mass index is 28.52 kg/m. Physical Exam: General Appearance:  Alert, cooperative, no distress, appears stated age  Head:  Normocephalic, without obvious abnormality, atraumatic  Eyes:  Conjunctiva clear, EOM's intact  Ears EACs normal bilaterally and normal TMs bilaterally  Nose: Nares normal, hypertrophic turbinates, normal mucosa, and no visible anterior polyps  Throat: Lips, tongue normal; teeth and gums normal, normal posterior oropharynx  Neck: Supple, symmetrical  Lungs:   clear to auscultation bilaterally, Respirations unlabored, no coughing  Heart:  regular rate and rhythm and no murmur, Appears well perfused  Extremities: No edema  Skin: Skin color, texture, turgor normal and no rashes or lesions on visualized portions of skin  Neurologic: No gross deficits   Labs:  Lab Orders  No laboratory test(s) ordered today  Assessment/Plan   Seasonal and Perennial Allergic: at goal on AIT - allergy  testing 01/03/23  positive to grass pollen, weed pollen, tree pollen, dust mites, indoor/outdoor molds, dog, mixed feathers, horse, cockroach - Prevention:  - allergen avoidance when possible - continue your allergy  injections per protocol, when you return to [place] will restart in red vial on fast build-up. Keep bringing your epinephrine  autoinjector to your injection appointment - Symptom control: - Continue Dymista  1 spray twice daily as needed - Continue Xyzal  (Levocetirinze) 5mg  daily as needed  Allergic Conjunctivitis: at goal - Continue Allergy  Eye drops-pataday  1 drop each eye daily as needed  -Avoid eye drops that say red eye relief as they may contain medications that dry out your eyes.  Chronic Idiopathic Urticaria: none for last 12 months Therapy Plan:  - continue xyzal  (levocetirizine)  5 mg once daily - if hives are uncontrolled, increase xyzal  (levocetirizine)  5 mg twice daily - if hives remain uncontrolled, increase dose of xyzal  (levocetirizine)  10 mg (2 pills) twice daily- this is maximum dose - can increase or decrease dosing depending on symptom control to a maximum dose of 4 tablets of antihistamine daily. Wait until hives free for at least one month prior to decreasing dose.   - if hives are still uncontrolled with the above regimen, please arrange an appointment for discussion of Xolair (omalizumab)- an injectable medication for hives  Can use one of the following in place of zyrtec  if desires: Claritin (loratadine) 10 mg, Xyzal  (levocetirizine) 5 mg or Allegra (fexofenadine) 180 mg daily as needed  Follow up : 12 months, sooner if needed  It was a pleasure seeing you again in clinic today! Thank you for allowing me to participate in your care.  Other: allergy  injection given today  Rocky Endow, MD  [facility] 

## 2024-03-31 NOTE — Patient Instructions (Addendum)
 Seasonal and Perennial Allergic: - allergy  testing 01/03/23 positive to grass pollen, weed pollen, tree pollen, dust mites, indoor/outdoor molds, dog, mixed feathers, horse, cockroach - Prevention:  - allergen avoidance when possible - continue your allergy  injections per protocol, when you return to [place] will restart in red vial on fast build-up. Keep bringing your epinephrine  autoinjector to your injection appointment - Symptom control: - Continue Dymista  1 spray twice daily as needed - Continue Xyzal  (Levocetirinze) 5mg  daily as needed  Allergic Conjunctivitis:  - Continue Allergy  Eye drops-pataday  1 drop each eye daily as needed  -Avoid eye drops that say red eye relief as they may contain medications that dry out your eyes.  Chronic Idiopathic Urticaria: Therapy Plan:  - continue xyzal  (levocetirizine)  5 mg once daily - if hives are uncontrolled, increase xyzal  (levocetirizine)  5 mg twice daily - if hives remain uncontrolled, increase dose of xyzal  (levocetirizine)  10 mg (2 pills) twice daily- this is maximum dose - can increase or decrease dosing depending on symptom control to a maximum dose of 4 tablets of antihistamine daily. Wait until hives free for at least one month prior to decreasing dose.   - if hives are still uncontrolled with the above regimen, please arrange an appointment for discussion of Xolair (omalizumab)- an injectable medication for hives  Can use one of the following in place of zyrtec  if desires: Claritin (loratadine) 10 mg, Xyzal  (levocetirizine) 5 mg or Allegra (fexofenadine) 180 mg daily as needed  Follow up : 12 months, sooner if needed  It was a pleasure seeing you again in clinic today! Thank you for allowing me to participate in your care.  Rocky Endow, MD [facility]    Reducing Pollen Exposure  The American Academy of All[employer] , Asthma and Immun[employer] suggests the following steps to reduce your exposure to pollen during  allergy  seasons.    Do not hang sheets or clothing out to dry; pollen may collect on these items. Do not mow lawns or spend time around freshly cut grass; mowing stirs up pollen. Keep windows closed at night.  Keep car windows closed while driving. Minimize morning activities outdoors, a time when pollen counts are usually at their highest. Stay indoors as much as possible when pollen counts or humidity is high and on windy days when pollen tends to remain in the air longer. Use air conditioning when possible.  Many air conditioners have filters that trap the pollen spores. Use a HEPA room air filter to remove pollen form the indoor air you breathe. DUST MITE AVOIDANCE MEASURES:  There are three main measures that need and can be taken to avoid house dust mites:  Reduce accumulation of dust in general -reduce furniture, clothing, carpeting, books, stuffed animals, especially in bedroom  Separate yourself from the dust -use pillow and mattress encasements (can be found at stores such as [employer] or online) -avoid direct exposure to air condition flow -use a HEPA filter device, especially in the bedroom; you can also use a HEPA filter vacuum cleaner -wipe dust with a moist towel instead of a dry towel or broom when cleaning  Decrease mites and/or their secretions -wash clothing and linen and stuffed animals at highest temperature possible, at least every 2 weeks -stuffed animals can also be placed in a bag and put in a freezer overnight  Despite the above measures, it is impossible to eliminate dust mites or their allergen completely from your home.  With  the above measures the burden of mites in your home can be diminished, with the goal of minimizing your allergic symptoms.  Success will be reached only when implementing and using all means together. Control of Dog or Cat Allergen  Avoidance is the best way to manage a dog or cat allergy . If you have a dog or cat and are  allergic to dog or cats, consider removing the dog or cat from the home. If you have a dog or cat but don't want to find it a new home, or if your family wants a pet even though someone in the household is allergic, here are some strategies that may help keep symptoms at bay:  Keep the pet out of your bedroom and restrict it to only a few rooms. Be advised that keeping the dog or cat in only one room will not limit the allergens to that room. Don't pet, hug or kiss the dog or cat; if you do, wash your hands with soap and water. High-efficiency particulate air (HEPA) cleaners run continuously in a bedroom or living room can reduce allergen levels over time. Regular use of a high-efficiency vacuum cleaner or a central vacuum can reduce allergen levels. Giving your dog or cat a bath at least once a week can reduce airborne allergen. Control of Mold Allergen   Mold and fungi can grow on a variety of surfaces provided certain temperature and moisture conditions exist.  Outdoor molds grow on plants, decaying vegetation and soil.  The major outdoor mold, Alternaria and Cladosporium, are found in very high numbers during hot and dry conditions.  Generally, a late Summer - Fall peak is seen for common outdoor fungal spores.  Rain will temporarily lower outdoor mold spore count, but counts rise rapidly when the rainy period ends.  The most important indoor molds are Aspergillus and Penicillium.  Dark, humid and poorly ventilated basements are ideal sites for mold growth.  The next most common sites of mold growth are the bathroom and the kitchen.  Outdoor (Seasonal) Mold Control  Use air conditioning and keep windows closed Avoid exposure to decaying vegetation. Avoid leaf raking. Avoid grain handling. Consider wearing a face mask if working in moldy areas.    Indoor (Perennial) Mold Control   Maintain humidity below 50%. Clean washable surfaces with 5% bleach solution. Remove sources e.g.  contaminated carpets.   Control of Cockroach Allergen  Cockroach allergen has been identified as an important cause of acute attacks of asthma, especially in urban settings.  There are fifty-five species of cockroach that exist in the [place] , however only three, the [place], [place] and [place] species produce allergen that can affect patients with Asthma.  Allergens can be obtained from fecal particles, egg casings and secretions from cockroaches.    Remove food sources. Reduce access to water. Seal access and entry points. Spray runways with 0.5-1% Diazinon or Chlorpyrifos Blow boric acid power under stoves and refrigerator. Place bait stations (hydramethylnon) at feeding sites.

## 2024-05-12 ENCOUNTER — Ambulatory Visit

## 2024-05-12 DIAGNOSIS — J309 Allergic rhinitis, unspecified: Secondary | ICD-10-CM | POA: Diagnosis not present

## 2024-05-19 ENCOUNTER — Ambulatory Visit

## 2024-05-19 DIAGNOSIS — J309 Allergic rhinitis, unspecified: Secondary | ICD-10-CM | POA: Diagnosis not present

## 2024-05-26 ENCOUNTER — Ambulatory Visit (INDEPENDENT_AMBULATORY_CARE_PROVIDER_SITE_OTHER)

## 2024-05-26 DIAGNOSIS — J309 Allergic rhinitis, unspecified: Secondary | ICD-10-CM | POA: Diagnosis not present

## 2024-06-23 ENCOUNTER — Ambulatory Visit

## 2024-06-23 DIAGNOSIS — J309 Allergic rhinitis, unspecified: Secondary | ICD-10-CM

## 2024-07-24 ENCOUNTER — Ambulatory Visit

## 2024-07-24 DIAGNOSIS — J302 Other seasonal allergic rhinitis: Secondary | ICD-10-CM | POA: Diagnosis not present

## 2024-12-25 ENCOUNTER — Encounter: Admitting: Family
